# Patient Record
Sex: Female | Born: 1965 | Race: White | Hispanic: No | Marital: Married | Smoking: Current every day smoker
Health system: Southern US, Community
[De-identification: ages and names within clinical notes are randomized; demographics above are authoritative.]

## PROBLEM LIST (undated history)

## (undated) DIAGNOSIS — Z803 Family history of malignant neoplasm of breast: Secondary | ICD-10-CM

## (undated) DIAGNOSIS — Z86010 Personal history of colonic polyps: Secondary | ICD-10-CM

## (undated) DIAGNOSIS — K219 Gastro-esophageal reflux disease without esophagitis: Secondary | ICD-10-CM

## (undated) DIAGNOSIS — F17200 Nicotine dependence, unspecified, uncomplicated: Secondary | ICD-10-CM

## (undated) DIAGNOSIS — C50919 Malignant neoplasm of unspecified site of unspecified female breast: Secondary | ICD-10-CM

## (undated) DIAGNOSIS — Z923 Personal history of irradiation: Secondary | ICD-10-CM

## (undated) DIAGNOSIS — Z780 Asymptomatic menopausal state: Secondary | ICD-10-CM

## (undated) HISTORY — DX: Family history of malignant neoplasm of breast: Z80.3

## (undated) HISTORY — DX: Personal history of colonic polyps: Z86.010

## (undated) HISTORY — DX: Gastro-esophageal reflux disease without esophagitis: K21.9

---

## 1998-05-02 HISTORY — PX: BREAST ENHANCEMENT SURGERY: SHX7

## 2003-02-01 HISTORY — PX: BREAST IMPLANT REMOVAL: SUR1101

## 2013-01-31 DIAGNOSIS — Z923 Personal history of irradiation: Secondary | ICD-10-CM

## 2013-01-31 HISTORY — DX: Personal history of irradiation: Z92.3

## 2013-08-30 ENCOUNTER — Encounter (INDEPENDENT_AMBULATORY_CARE_PROVIDER_SITE_OTHER): Payer: Self-pay | Admitting: Surgery

## 2013-09-16 ENCOUNTER — Encounter (INDEPENDENT_AMBULATORY_CARE_PROVIDER_SITE_OTHER): Payer: Self-pay | Admitting: Surgery

## 2013-09-16 ENCOUNTER — Ambulatory Visit (INDEPENDENT_AMBULATORY_CARE_PROVIDER_SITE_OTHER): Payer: BC Managed Care – PPO | Admitting: Surgery

## 2013-09-16 VITALS — BP 110/60 | HR 70 | Resp 18 | Ht 63.0 in | Wt 146.0 lb

## 2013-09-16 DIAGNOSIS — C50412 Malignant neoplasm of upper-outer quadrant of left female breast: Secondary | ICD-10-CM | POA: Insufficient documentation

## 2013-09-16 DIAGNOSIS — D0590 Unspecified type of carcinoma in situ of unspecified breast: Secondary | ICD-10-CM

## 2013-09-16 DIAGNOSIS — D0512 Intraductal carcinoma in situ of left breast: Secondary | ICD-10-CM

## 2013-09-16 NOTE — Patient Instructions (Signed)
Our schedulers will call back to schedule surgery

## 2013-09-16 NOTE — Progress Notes (Signed)
Patient ID: Sophia Bryant, female   DOB: [birth year], 48 y.o.   MRN: [id number]  Chief Complaint  Patient presents with  . Other    Eval new left br ca    HPI Sophia Bryant is a 48 y.o. female.    HPIshe is referred by Dr.Maute in [place] after the recent diagnosis of ductal carcinoma in situ of the left breast. She had abnormal calcifications appear on her left breast screening mammogram. She has since had a stereotactic biopsy which showed DCIS. Invasive cancer could not totally be ruled out. She had ultrasound showing no mass. She does have cysts in her right breast. She denies nipple discharge. She has had a previous history of implants which have since been removed from her breasts. There is no family history of breast cancer.  History reviewed. No pertinent past medical history.  Past Surgical History  Procedure Laterality Date  . Breast enhancement surgery  05/1998  . Removal of bilateral tissue expanders with placement of bilateral breast implants  09/2003    Family History  Problem Relation Age of Onset  . Cancer Father     Social History History  Substance Use Topics  . Smoking status: Current Every Day Smoker  . Smokeless tobacco: Not on file  . Alcohol Use: Yes    Allergies  Allergen Reactions  . Erythromycin Hives and Itching    No current outpatient prescriptions on file.   No current facility-administered medications for this visit.    Review of Systems Review of Systems  Constitutional: Negative for fever, chills and unexpected weight change.  HENT: Negative for congestion, hearing loss, sore throat, trouble swallowing and voice change.   Eyes: Negative for visual disturbance.  Respiratory: Negative for cough and wheezing.   Cardiovascular: Negative for chest pain, palpitations and leg swelling.  Gastrointestinal: Negative for nausea, vomiting, abdominal pain, diarrhea, constipation, blood in stool, abdominal distention and anal bleeding.   Genitourinary: Negative for hematuria, vaginal bleeding and difficulty urinating.  Musculoskeletal: Negative for arthralgias.  Skin: Negative for rash and wound.  Neurological: Negative for seizures, syncope and headaches.  Hematological: Negative for adenopathy. Does not bruise/bleed easily.  Psychiatric/Behavioral: Negative for confusion.    Blood pressure 110/60, pulse 70, resp. rate 18, height 5\' 3"  (1.6 m), weight 146 lb (66.225 kg).  Physical Exam Physical Exam  Constitutional: She is oriented to person, place, and time. She appears well-developed and well-nourished. No distress.  HENT:  Head: Normocephalic and atraumatic.  Right Ear: External ear normal.  Left Ear: External ear normal.  Nose: Nose normal.  Mouth/Throat: Oropharynx is clear and moist. No oropharyngeal exudate.  Eyes: Conjunctivae are normal. Pupils are equal, round, and reactive to light. Right eye exhibits no discharge. Left eye exhibits no discharge. No scleral icterus.  Neck: Normal range of motion. Neck supple. No tracheal deviation present.  Cardiovascular: Normal rate, regular rhythm, normal heart sounds and intact distal pulses.   No murmur heard. Pulmonary/Chest: Effort normal and breath sounds normal. No respiratory distress. She has no wheezes.  Musculoskeletal: Normal range of motion. She exhibits no edema and no tenderness.  Lymphadenopathy:    She has no cervical adenopathy.    She has no axillary adenopathy.  Neurological: She is alert and oriented to person, place, and time.  Skin: Skin is warm and dry. No rash noted. She is not diaphoretic. No erythema.  Psychiatric: Her behavior is normal. Judgment normal.    Data Reviewed I have her mammograms demonstrating the calcifications  in the upper-outer quadrant left breast. The biopsy confirms ductal carcinoma in situ.  Assessment    Left breast ductal carcinoma in situ     Plan    I discussed this with the patient and her family in  detail. I discussed removing just the cancer with breast conservation versus mastectomy. I discussed needle localized biopsy. At the time of her original biopsy, a marker clip was not placed. This may make it slightly difficult for our radiologist but hopefully not. I discussed the risks of surgery which includes but is not limited to bleeding, infection, need for further surgery if the margins are positive, finding invasive cancer, et Ronney Asters. She understands and wishes to proceed with surgery        Arthor Gorter A 09/16/2013, 10:22 AM

## 2013-09-23 ENCOUNTER — Encounter (HOSPITAL_BASED_OUTPATIENT_CLINIC_OR_DEPARTMENT_OTHER): Payer: Self-pay | Admitting: *Deleted

## 2013-09-23 NOTE — Progress Notes (Signed)
No labs needed

## 2013-09-24 NOTE — H&P (Signed)
Chief Complaint   Patient presents with   .  Other     Eval new left br ca   HPI  Sophia Bryant is a 48 y.o. female.  HPIshe is referred by Dr.Maute in [place] after the recent diagnosis of ductal carcinoma in situ of the left breast. She had abnormal calcifications appear on her left breast screening mammogram. She has since had a stereotactic biopsy which showed DCIS. Invasive cancer could not totally be ruled out. She had ultrasound showing no mass. She does have cysts in her right breast. She denies nipple discharge. She has had a previous history of implants which have since been removed from her breasts. There is no family history of breast cancer.  History reviewed. No pertinent past medical history.  Past Surgical History   Procedure  Laterality  Date   .  Breast enhancement surgery   05/1998   .  Removal of bilateral tissue expanders with placement of bilateral breast implants   09/2003    Family History   Problem  Relation  Age of Onset   .  Cancer  Father    Social History  History   Substance Use Topics   .  Smoking status:  Current Every Day Smoker   .  Smokeless tobacco:  Not on file   .  Alcohol Use:  Yes    Allergies   Allergen  Reactions   .  Erythromycin  Hives and Itching    No current outpatient prescriptions on file.    No current facility-administered medications for this visit.   Review of Systems  Review of Systems  Constitutional: Negative for fever, chills and unexpected weight change.  HENT: Negative for congestion, hearing loss, sore throat, trouble swallowing and voice change.  Eyes: Negative for visual disturbance.  Respiratory: Negative for cough and wheezing.  Cardiovascular: Negative for chest pain, palpitations and leg swelling.  Gastrointestinal: Negative for nausea, vomiting, abdominal pain, diarrhea, constipation, blood in stool, abdominal distention and anal bleeding.  Genitourinary: Negative for hematuria, vaginal bleeding and  difficulty urinating.  Musculoskeletal: Negative for arthralgias.  Skin: Negative for rash and wound.  Neurological: Negative for seizures, syncope and headaches.  Hematological: Negative for adenopathy. Does not bruise/bleed easily.  Psychiatric/Behavioral: Negative for confusion.  Blood pressure 110/60, pulse 70, resp. rate 18, height 5\' 3"  (1.6 m), weight 146 lb (66.225 kg).  Physical Exam  Physical Exam  Constitutional: She is oriented to person, place, and time. She appears well-developed and well-nourished. No distress.  HENT:  Head: Normocephalic and atraumatic.  Right Ear: External ear normal.  Left Ear: External ear normal.  Nose: Nose normal.  Mouth/Throat: Oropharynx is clear and moist. No oropharyngeal exudate.  Eyes: Conjunctivae are normal. Pupils are equal, round, and reactive to light. Right eye exhibits no discharge. Left eye exhibits no discharge. No scleral icterus.  Neck: Normal range of motion. Neck supple. No tracheal deviation present.  Cardiovascular: Normal rate, regular rhythm, normal heart sounds and intact distal pulses.  No murmur heard.  Pulmonary/Chest: Effort normal and breath sounds normal. No respiratory distress. She has no wheezes.  Musculoskeletal: Normal range of motion. She exhibits no edema and no tenderness.  Lymphadenopathy:  She has no cervical adenopathy.  She has no axillary adenopathy.  Neurological: She is alert and oriented to person, place, and time.  Skin: Skin is warm and dry. No rash noted. She is not diaphoretic. No erythema.  Psychiatric: Her behavior is normal. Judgment normal.  Data  Reviewed  I have her mammograms demonstrating the calcifications in the upper-outer quadrant left breast. The biopsy confirms ductal carcinoma in situ.  Assessment  Left breast ductal carcinoma in situ  Plan  I discussed this with the patient and her family in detail. I discussed removing just the cancer with breast conservation versus mastectomy. I  discussed needle localized biopsy. At the time of her original biopsy, a marker clip was not placed. This may make it slightly difficult for our radiologist but hopefully not. I discussed the risks of surgery which includes but is not limited to bleeding, infection, need for further surgery if the margins are positive, finding invasive cancer, et Ronney Asters. She understands and wishes to proceed with surgery

## 2013-09-25 ENCOUNTER — Ambulatory Visit
Admission: RE | Admit: 2013-09-25 | Discharge: 2013-09-25 | Disposition: A | Discharge status: Home / Self Care | Payer: BC Managed Care – PPO | Source: Ambulatory Visit | Attending: Surgery | Admitting: Surgery

## 2013-09-25 ENCOUNTER — Ambulatory Visit (HOSPITAL_BASED_OUTPATIENT_CLINIC_OR_DEPARTMENT_OTHER): Payer: BC Managed Care – PPO | Admitting: Anesthesiology

## 2013-09-25 ENCOUNTER — Encounter (HOSPITAL_BASED_OUTPATIENT_CLINIC_OR_DEPARTMENT_OTHER): Payer: Self-pay | Admitting: *Deleted

## 2013-09-25 ENCOUNTER — Encounter (HOSPITAL_BASED_OUTPATIENT_CLINIC_OR_DEPARTMENT_OTHER): Payer: BC Managed Care – PPO | Admitting: Anesthesiology

## 2013-09-25 ENCOUNTER — Ambulatory Visit (HOSPITAL_BASED_OUTPATIENT_CLINIC_OR_DEPARTMENT_OTHER)
Admission: RE | Admit: 2013-09-25 | Discharge: 2013-09-25 | Disposition: A | Discharge status: Home / Self Care | Payer: BC Managed Care – PPO | Source: Ambulatory Visit | Attending: Surgery | Admitting: Surgery

## 2013-09-25 ENCOUNTER — Encounter (HOSPITAL_BASED_OUTPATIENT_CLINIC_OR_DEPARTMENT_OTHER)
Admission: RE | Disposition: A | Discharge status: Home / Self Care | Payer: Self-pay | Source: Ambulatory Visit | Attending: Surgery

## 2013-09-25 ENCOUNTER — Other Ambulatory Visit (INDEPENDENT_AMBULATORY_CARE_PROVIDER_SITE_OTHER): Payer: Self-pay | Admitting: Surgery

## 2013-09-25 DIAGNOSIS — D0512 Intraductal carcinoma in situ of left breast: Secondary | ICD-10-CM

## 2013-09-25 DIAGNOSIS — C50919 Malignant neoplasm of unspecified site of unspecified female breast: Secondary | ICD-10-CM

## 2013-09-25 DIAGNOSIS — F17200 Nicotine dependence, unspecified, uncomplicated: Secondary | ICD-10-CM | POA: Insufficient documentation

## 2013-09-25 DIAGNOSIS — D0590 Unspecified type of carcinoma in situ of unspecified breast: Secondary | ICD-10-CM | POA: Insufficient documentation

## 2013-09-25 HISTORY — PX: BREAST LUMPECTOMY WITH NEEDLE LOCALIZATION: SHX5759

## 2013-09-25 HISTORY — PX: BREAST LUMPECTOMY: SHX2

## 2013-09-25 HISTORY — DX: Asymptomatic menopausal state: Z78.0

## 2013-09-25 HISTORY — DX: Nicotine dependence, unspecified, uncomplicated: F17.200

## 2013-09-25 SURGERY — BREAST LUMPECTOMY WITH NEEDLE LOCALIZATION
Anesthesia: General | Site: Breast | Laterality: Left

## 2013-09-25 MED ORDER — MIDAZOLAM HCL 2 MG/2ML IJ SOLN: 1.0000 mg | INTRAMUSCULAR | Status: DC | PRN

## 2013-09-25 MED ORDER — FENTANYL CITRATE 0.05 MG/ML IJ SOLN
INTRAMUSCULAR | Status: AC
  Filled 2013-09-25: qty 2

## 2013-09-25 MED ORDER — DEXAMETHASONE SODIUM PHOSPHATE 4 MG/ML IJ SOLN
INTRAMUSCULAR | Status: DC | PRN
  Administered 2013-09-25: 10 mg via INTRAVENOUS

## 2013-09-25 MED ORDER — OXYCODONE HCL 5 MG PO TABS: 5.0000 mg | ORAL_TABLET | Freq: Once | ORAL | Status: DC | PRN

## 2013-09-25 MED ORDER — FENTANYL CITRATE 0.05 MG/ML IJ SOLN
25.0000 ug | INTRAMUSCULAR | Status: DC | PRN
  Administered 2013-09-25: 50 ug via INTRAVENOUS

## 2013-09-25 MED ORDER — CEFAZOLIN SODIUM-DEXTROSE 2-3 GM-% IV SOLR
INTRAVENOUS | Status: AC
  Filled 2013-09-25: qty 50

## 2013-09-25 MED ORDER — FENTANYL CITRATE 0.05 MG/ML IJ SOLN
INTRAMUSCULAR | Status: DC | PRN
  Administered 2013-09-25 (×2): 50 ug via INTRAVENOUS

## 2013-09-25 MED ORDER — LACTATED RINGERS IV SOLN
INTRAVENOUS | Status: DC
  Administered 2013-09-25 (×2): via INTRAVENOUS

## 2013-09-25 MED ORDER — HYDROCODONE-ACETAMINOPHEN 5-325 MG PO TABS: 1.0000 | ORAL_TABLET | ORAL | Status: DC | PRN

## 2013-09-25 MED ORDER — CEFAZOLIN SODIUM-DEXTROSE 2-3 GM-% IV SOLR
2.0000 g | INTRAVENOUS | Status: AC
  Administered 2013-09-25: 2 g via INTRAVENOUS

## 2013-09-25 MED ORDER — MIDAZOLAM HCL 5 MG/5ML IJ SOLN
INTRAMUSCULAR | Status: DC | PRN
  Administered 2013-09-25: 2 mg via INTRAVENOUS

## 2013-09-25 MED ORDER — FENTANYL CITRATE 0.05 MG/ML IJ SOLN
INTRAMUSCULAR | Status: AC
  Filled 2013-09-25: qty 6

## 2013-09-25 MED ORDER — FENTANYL CITRATE 0.05 MG/ML IJ SOLN: 50.0000 ug | INTRAMUSCULAR | Status: DC | PRN

## 2013-09-25 MED ORDER — PROPOFOL 10 MG/ML IV BOLUS
INTRAVENOUS | Status: DC | PRN
  Administered 2013-09-25: 200 mg via INTRAVENOUS

## 2013-09-25 MED ORDER — ONDANSETRON HCL 4 MG/2ML IJ SOLN: 4.0000 mg | Freq: Four times a day (QID) | INTRAMUSCULAR | Status: DC | PRN

## 2013-09-25 MED ORDER — LIDOCAINE HCL (CARDIAC) 20 MG/ML IV SOLN
INTRAVENOUS | Status: DC | PRN
  Administered 2013-09-25: 60 mg via INTRAVENOUS

## 2013-09-25 MED ORDER — CEFAZOLIN SODIUM-DEXTROSE 2-3 GM-% IV SOLR: 2.0000 g | INTRAVENOUS | Status: DC

## 2013-09-25 MED ORDER — BUPIVACAINE-EPINEPHRINE 0.5% -1:200000 IJ SOLN
INTRAMUSCULAR | Status: DC | PRN
  Administered 2013-09-25: 20 mL

## 2013-09-25 MED ORDER — MIDAZOLAM HCL 2 MG/2ML IJ SOLN
INTRAMUSCULAR | Status: AC
  Filled 2013-09-25: qty 2

## 2013-09-25 MED ORDER — OXYCODONE HCL 5 MG/5ML PO SOLN: 5.0000 mg | Freq: Once | ORAL | Status: DC | PRN

## 2013-09-25 MED ORDER — ONDANSETRON HCL 4 MG/2ML IJ SOLN
INTRAMUSCULAR | Status: DC | PRN
  Administered 2013-09-25: 4 mg via INTRAVENOUS

## 2013-09-25 SURGICAL SUPPLY — 45 items
BENZOIN TINCTURE PRP APPL 2/3 (GAUZE/BANDAGES/DRESSINGS) ×3 IMPLANT
BLADE HEX COATED 2.75 (ELECTRODE) ×3 IMPLANT
BLADE SURG 15 STRL LF DISP TIS (BLADE) ×1 IMPLANT
BLADE SURG 15 STRL SS (BLADE) ×2
CANISTER SUCT 1200ML W/VALVE (MISCELLANEOUS) IMPLANT
CHLORAPREP W/TINT 26ML (MISCELLANEOUS) ×3 IMPLANT
CLIP TI WIDE RED SMALL 6 (CLIP) IMPLANT
CLOSURE WOUND 1/2 X4 (GAUZE/BANDAGES/DRESSINGS) ×1
COVER MAYO STAND STRL (DRAPES) ×3 IMPLANT
COVER TABLE BACK 60X90 (DRAPES) ×3 IMPLANT
DECANTER SPIKE VIAL GLASS SM (MISCELLANEOUS) IMPLANT
DEVICE DUBIN W/COMP PLATE 8390 (MISCELLANEOUS) IMPLANT
DRAPE PED LAPAROTOMY (DRAPES) ×3 IMPLANT
DRAPE UTILITY XL STRL (DRAPES) ×3 IMPLANT
DRSG TEGADERM 4X4.75 (GAUZE/BANDAGES/DRESSINGS) ×3 IMPLANT
ELECT REM PT RETURN 9FT ADLT (ELECTROSURGICAL) ×3
ELECTRODE REM PT RTRN 9FT ADLT (ELECTROSURGICAL) ×1 IMPLANT
GLOVE BIO SURGEON STRL SZ 6.5 (GLOVE) ×2 IMPLANT
GLOVE BIO SURGEONS STRL SZ 6.5 (GLOVE) ×1
GLOVE BIOGEL PI IND STRL 7.0 (GLOVE) ×2 IMPLANT
GLOVE BIOGEL PI INDICATOR 7.0 (GLOVE) ×4
GLOVE SURG SIGNA 7.5 PF LTX (GLOVE) ×3 IMPLANT
GOWN STRL REUS W/ TWL LRG LVL3 (GOWN DISPOSABLE) ×1 IMPLANT
GOWN STRL REUS W/ TWL XL LVL3 (GOWN DISPOSABLE) ×1 IMPLANT
GOWN STRL REUS W/TWL LRG LVL3 (GOWN DISPOSABLE) ×2
GOWN STRL REUS W/TWL XL LVL3 (GOWN DISPOSABLE) ×2
KIT MARKER MARGIN INK (KITS) ×3 IMPLANT
NEEDLE HYPO 25X1 1.5 SAFETY (NEEDLE) ×3 IMPLANT
NS IRRIG 1000ML POUR BTL (IV SOLUTION) ×3 IMPLANT
PACK BASIN DAY SURGERY FS (CUSTOM PROCEDURE TRAY) ×3 IMPLANT
PENCIL BUTTON HOLSTER BLD 10FT (ELECTRODE) ×3 IMPLANT
SLEEVE SCD COMPRESS KNEE MED (MISCELLANEOUS) IMPLANT
SPONGE GAUZE 4X4 12PLY STER LF (GAUZE/BANDAGES/DRESSINGS) ×3 IMPLANT
SPONGE LAP 4X18 X RAY DECT (DISPOSABLE) ×3 IMPLANT
STRIP CLOSURE SKIN 1/2X4 (GAUZE/BANDAGES/DRESSINGS) ×2 IMPLANT
SUT MNCRL AB 4-0 PS2 18 (SUTURE) ×3 IMPLANT
SUT SILK 2 0 SH (SUTURE) ×3 IMPLANT
SUT VIC AB 3-0 SH 27 (SUTURE) ×2
SUT VIC AB 3-0 SH 27X BRD (SUTURE) ×1 IMPLANT
SYR CONTROL 10ML LL (SYRINGE) ×3 IMPLANT
TOWEL OR 17X24 6PK STRL BLUE (TOWEL DISPOSABLE) ×3 IMPLANT
TOWEL OR NON WOVEN STRL DISP B (DISPOSABLE) ×3 IMPLANT
TUBE CONNECTING 20'X1/4 (TUBING)
TUBE CONNECTING 20X1/4 (TUBING) IMPLANT
YANKAUER SUCT BULB TIP NO VENT (SUCTIONS) IMPLANT

## 2013-09-25 NOTE — Interval H&P Note (Signed)
History and Physical Interval Note: no change in H and P  09/25/2013 12:10 PM  [name]  has presented today for surgery, with the diagnosis of left breast DCIS  The various methods of treatment have been discussed with the patient and family. After consideration of risks, benefits and other options for treatment, the patient has consented to  Procedure(s): BREAST LUMPECTOMY LEFT NEEDLE LOCALIZATION (Left) as Bryant surgical intervention .  The patient's history has been reviewed, patient examined, no change in status, stable for surgery.  I have reviewed the patient's chart and labs.  Questions were answered to the patient's satisfaction.     Sophia Bryant

## 2013-09-25 NOTE — Transfer of Care (Signed)
Immediate Anesthesia Transfer of Care Note  Patient: Sophia Bryant  Procedure(s) Performed: Procedure(s): BREAST LUMPECTOMY LEFT NEEDLE LOCALIZATION (Left)  Patient Location: PACU  Anesthesia Type:General  Level of Consciousness: awake and patient cooperative  Airway & Oxygen Therapy: Patient Spontanous Breathing and Patient connected to face mask oxygen  Post-op Assessment: Report given to PACU RN and Post -op Vital signs reviewed and stable  Post vital signs: Reviewed and stable  Complications: No apparent anesthesia complications

## 2013-09-25 NOTE — Anesthesia Postprocedure Evaluation (Signed)
Anesthesia Post Note  Patient: Sophia Bryant  Procedure(s) Performed: Procedure(s) (LRB): BREAST LUMPECTOMY LEFT NEEDLE LOCALIZATION (Left)  Anesthesia type: General  Patient location: PACU  Post pain: Pain level controlled and Adequate analgesia  Post assessment: Post-op Vital signs reviewed, Patient's Cardiovascular Status Stable, Respiratory Function Stable, Patent Airway and Pain level controlled  Last Vitals:  Filed Vitals:   09/25/13 1430  BP: 92/51  Pulse: 72  Temp:   Resp: 16    Post vital signs: Reviewed and stable  Level of consciousness: awake, alert  and oriented  Complications: No apparent anesthesia complications

## 2013-09-25 NOTE — Anesthesia Procedure Notes (Signed)
Procedure Name: LMA Insertion Date/Time: 09/25/2013 12:54 PM Performed by: Jennfer Gassen Pre-anesthesia Checklist: Patient identified, Emergency Drugs available, Suction available and Patient being monitored Patient Re-evaluated:Patient Re-evaluated prior to inductionOxygen Delivery Method: Circle System Utilized Preoxygenation: Pre-oxygenation with 100% oxygen Intubation Type: IV induction Ventilation: Mask ventilation without difficulty LMA: LMA inserted LMA Size: 4.0 Number of attempts: 1 Airway Equipment and Method: bite block Placement Confirmation: positive ETCO2 Tube secured with: Tape Dental Injury: Teeth and Oropharynx as per pre-operative assessment

## 2013-09-25 NOTE — Anesthesia Preprocedure Evaluation (Signed)
Anesthesia Evaluation  Patient identified by MRN, date of birth, ID band Patient awake    Reviewed: Allergy & Precautions, H&P , NPO status , Patient's Chart, lab work & pertinent test results  Airway Mallampati: II  Neck ROM: full    Dental   Pulmonary Current Smoker,          Cardiovascular negative cardio ROS      Neuro/Psych    GI/Hepatic   Endo/Other    Renal/GU      Musculoskeletal   Abdominal   Peds  Hematology   Anesthesia Other Findings   Reproductive/Obstetrics                           Anesthesia Physical Anesthesia Plan  ASA: II  Anesthesia Plan: General   Post-op Pain Management:    Induction: Intravenous  Airway Management Planned: LMA  Additional Equipment:   Intra-op Plan:   Post-operative Plan:   Informed Consent: I have reviewed the patients History and Physical, chart, labs and discussed the procedure including the risks, benefits and alternatives for the proposed anesthesia with the patient or authorized representative who has indicated his/her understanding and acceptance.     Plan Discussed with: CRNA, Anesthesiologist and Surgeon  Anesthesia Plan Comments:         Anesthesia Quick Evaluation

## 2013-09-25 NOTE — Op Note (Signed)
BREAST LUMPECTOMY LEFT NEEDLE LOCALIZATION  Procedure Note  Sophia Bryant 09/25/2013   Pre-op Diagnosis: left breast DCIS     Post-op Diagnosis: same  Procedure(s): BREAST LUMPECTOMY LEFT NEEDLE LOCALIZATION  Surgeon(s): Harl Bowie, MD  Anesthesia: General  Staff:  Circulator: Lauris Chroman, RN Relief Circulator: Humberto Seals, RN; Eda Paschal, RN Relief Scrub: Irineo Axon Bouchillon, RN  Estimated Blood Loss: Minimal               Specimens: sent to path          [facility]   Date: 09/25/2013  Time: 1:30 PM

## 2013-09-25 NOTE — Discharge Instructions (Signed)
[facility] °Office Phone Number [phone] ° °BREAST BIOPSY/ PARTIAL MASTECTOMY: POST OP INSTRUCTIONS ° °Always review your discharge instruction sheet given to you by the facility where your surgery was performed. ° °IF YOU HAVE DISABILITY OR FAMILY LEAVE FORMS, YOU MUST BRING THEM TO THE OFFICE FOR PROCESSING.  DO NOT GIVE THEM TO YOUR DOCTOR. ° °1. A prescription for pain medication may be given to you upon discharge.  Take your pain medication as prescribed, if needed.  If narcotic pain medicine is not needed, then you may take acetaminophen (Tylenol) or ibuprofen (Advil) as needed. °2. Take your usually prescribed medications unless otherwise directed °3. If you need a refill on your pain medication, please contact your pharmacy.  They will contact our office to request authorization.  Prescriptions will not be filled after 5pm or on week-ends. °4. You should eat very light the first 24 hours after surgery, such as soup, crackers, pudding, etc.  Resume your normal diet the day after surgery. °5. Most patients will experience some swelling and bruising in the breast.  Ice packs and a good support bra will help.  Swelling and bruising can take several days to resolve.  °6. It is common to experience some constipation if taking pain medication after surgery.  Increasing fluid intake and taking a stool softener will usually help or prevent this problem from occurring.  A mild laxative (Milk of Magnesia or Miralax) should be taken according to package directions if there are no bowel movements after 48 hours. °7. Unless discharge instructions indicate otherwise, you may remove your bandages 24-48 hours after surgery, and you may shower at that time.  You may have steri-strips (small skin tapes) in place directly over the incision.  These strips should be left on the skin for 7-10 days.  If your surgeon used skin glue on the incision, you may shower in 24 hours.  The glue will flake off over the  next 2-3 weeks.  Any sutures or staples will be removed at the office during your follow-up visit. °8. ACTIVITIES:  You may resume regular daily activities (gradually increasing) beginning the next day.  Wearing a good support bra or sports bra minimizes pain and swelling.  You may have sexual intercourse when it is comfortable. °a. You may drive when you no longer are taking prescription pain medication, you can comfortably wear a seatbelt, and you can safely maneuver your car and apply brakes. °b. RETURN TO WORK:  ______________________________________________________________________________________ °9. You should see your doctor in the office for a follow-up appointment approximately two weeks after your surgery.  Your doctor’s nurse will typically make your follow-up appointment when she calls you with your pathology report.  Expect your pathology report 2-3 business days after your surgery.  You may call to check if you do not hear from us after three days. °10. OTHER INSTRUCTIONS: _______________________________________________________________________________________________ _____________________________________________________________________________________________________________________________________ °_____________________________________________________________________________________________________________________________________ °_____________________________________________________________________________________________________________________________________ ° °WHEN TO CALL YOUR DOCTOR: °1. Fever over 101.0 °2. Nausea and/or vomiting. °3. Extreme swelling or bruising. °4. Continued bleeding from incision. °5. Increased pain, redness, or drainage from the incision. ° °The clinic staff is available to answer your questions during regular business hours.  Please don’t hesitate to call and ask to speak to one of the nurses for clinical concerns.  If you have a medical emergency, go to the nearest  emergency room or call 911.  A surgeon from [facility] is always on call at the hospital. ° °For further questions, please visit [email or web address]  ° ° °  Post Anesthesia Home Care Instructions ° °Activity: °Get plenty of rest for the remainder of the day. A responsible adult should stay with you for 24 hours following the procedure.  °For the next 24 hours, DO NOT: °-Drive a car °-Operate machinery °-Drink alcoholic beverages °-Take any medication unless instructed by your physician °-Make any legal decisions or sign important papers. ° °Meals: °Start with liquid foods such as gelatin or soup. Progress to regular foods as tolerated. Avoid greasy, spicy, heavy foods. If nausea and/or vomiting occur, drink only clear liquids until the nausea and/or vomiting subsides. Call your physician if vomiting continues. ° °Special Instructions/Symptoms: °Your throat may feel dry or sore from the anesthesia or the breathing tube placed in your throat during surgery. If this causes discomfort, gargle with warm salt water. The discomfort should disappear within 24 hours. ° °

## 2013-09-26 ENCOUNTER — Encounter (HOSPITAL_BASED_OUTPATIENT_CLINIC_OR_DEPARTMENT_OTHER): Payer: Self-pay | Admitting: Surgery

## 2013-09-26 LAB — POCT HEMOGLOBIN-HEMACUE: Hemoglobin: 15.6 g/dL — ABNORMAL HIGH (ref 12.0–15.0)

## 2013-09-29 NOTE — Op Note (Signed)
NAMETAMIYA, COLELLO NO.:  [id number]  MEDICAL RECORD NO.:  [id number]  LOCATION:                                 FACILITY:  PHYSICIAN:  Coralie Keens, M.D. DATE OF BIRTH:  04/01/1965  DATE OF PROCEDURE:  09/25/2013 DATE OF DISCHARGE:  09/25/2013                              OPERATIVE REPORT   PREOPERATIVE DIAGNOSIS:  Left breast ductal carcinoma in situ.  POSTOPERATIVE DIAGNOSIS:  Left breast ductal carcinoma in.  PROCEDURES:  Needle localized left breast lumpectomy.  SURGEON:  Coralie Keens, M.D.  ANESTHESIA:  General and 0.5% Marcaine.  ESTIMATED BLOOD LOSS:  Minimal.  INDICATIONS:  This is a 48 year old female, who presents with abnormal calcifications in the left breast.  Stereotactic biopsy was performed, which showed ductal carcinoma in situ.  The invasive disease cannot be ruled out.  Decision was made to proceed with a needle localized left breast lumpectomy.  PROCEDURE IN DETAIL:  The patient had a localization wire placed in left breast at the [facility].  She was then brought over to the operating room.  She was placed supine on the operating table and general anesthesia was induced.  Her left breast was then prepped and draped in usual sterile fashion.  I anesthetized the skin and the upper outer quadrant of the left breast along the localization wire.  I then made an elliptical incision with a scalpel incorporating the wire.  I then took this down to the breast tissue with electrocautery.  I then performed a wide lumpectomy going all way down to the chest wall.  It was dissected circumferentially around localization wire dissecting out all the breast tissue with wide margins around the wire.  Once the specimen was completely removed, it was painted with marker paint in all quadrants.  It was then x-rayed and suspicious calcifications were found in the center of the biopsy specimen.  The specimen was sent  to Pathology for evaluation.  I anesthetized the wound further with Marcaine.  I achieved hemostasis with cautery.  I then closed subcutaneous tissue with interrupted 3-0 Vicryl sutures and closed the skin with a running 4-0 Monocryl.  Steri-Strips, gauze, and Tegaderm were then applied.  The patient tolerated the procedure well.  All the counts were correct at the end of procedure.  The patient was then extubated in the operating room and taken in stable condition to recovery room.    Coralie Keens, M.D.    DB/MEDQ  D:  09/25/2013  T:  09/25/2013  Job:  [id number]

## 2013-09-30 ENCOUNTER — Other Ambulatory Visit (INDEPENDENT_AMBULATORY_CARE_PROVIDER_SITE_OTHER): Payer: Self-pay | Admitting: Surgery

## 2013-10-03 ENCOUNTER — Encounter (HOSPITAL_COMMUNITY): Payer: Self-pay | Admitting: Pharmacy Technician

## 2013-10-04 ENCOUNTER — Encounter (HOSPITAL_COMMUNITY): Payer: Self-pay | Admitting: *Deleted

## 2013-10-04 ENCOUNTER — Inpatient Hospital Stay (HOSPITAL_COMMUNITY): Admission: RE | Admit: 2013-10-04 | Payer: BC Managed Care – PPO | Source: Ambulatory Visit

## 2013-10-07 MED ORDER — CEFAZOLIN SODIUM-DEXTROSE 2-3 GM-% IV SOLR
2.0000 g | INTRAVENOUS | Status: AC
  Administered 2013-10-08: 2 g via INTRAVENOUS
  Filled 2013-10-07: qty 50

## 2013-10-07 NOTE — H&P (Signed)
Sophia Bryant is an 48 y.o. female.   Chief Complaint: positive margin HPI: she is s/p left breast lumpectomy for DCIS.  One margin was focally positive so re-excision is recommended.  She is doing well and has no complaints  Past Medical History  Diagnosis Date  . Smokes   . Postmenopausal     greater than a yr since menses-2015  . Breast cancer     Past Surgical History  Procedure Laterality Date  . Breast enhancement surgery  05/1998  . Breast implant removal  2005    bilat  . Breast lumpectomy with needle localization Left 09/25/2013    Procedure: BREAST LUMPECTOMY LEFT NEEDLE LOCALIZATION;  Surgeon: Harl Bowie, MD;  Location: [facility];  Service: General;  Laterality: Left;    Family History  Problem Relation Age of Onset  . Cancer Father    Social History:  reports that she has been smoking.  She does not have any smokeless tobacco history on file. She reports that she drinks alcohol. She reports that she does not use illicit drugs.  Allergies:  Allergies  Allergen Reactions  . Erythromycin Hives and Itching    No prescriptions prior to admission    No results found for this or any previous visit (from the past 48 hour(s)). No results found.  Review of Systems  All other systems reviewed and are negative.   Height 5\' 3"  (1.6 m), weight 144 lb (65.318 kg). Physical Exam  Constitutional: She appears well-developed and well-nourished. No distress.  HENT:  Head: Normocephalic and atraumatic.  Left Ear: External ear normal.  Eyes: Pupils are equal, round, and reactive to light.  Neck: Normal range of motion.  Cardiovascular: Normal rate and regular rhythm.   Respiratory: Effort normal and breath sounds normal.  GI: Soft. There is no tenderness.  Musculoskeletal: Normal range of motion.  Neurological: She is alert.  Skin: Skin is warm.  Psychiatric: Her behavior is normal.   left breast incision healing well  Assessment/Plan DCIS left  breast  Will proceed to the OR for re-excision of the left breast to try and achieve negative margins.  I explained the risks which include bleeding, infection, need for further surgery is margins are again positive, etc.  She agrees to proceed.  Sophia Bryant A 10/07/2013, 12:57 PM

## 2013-10-08 ENCOUNTER — Encounter (HOSPITAL_COMMUNITY): Payer: BC Managed Care – PPO | Admitting: Anesthesiology

## 2013-10-08 ENCOUNTER — Ambulatory Visit (HOSPITAL_COMMUNITY): Payer: BC Managed Care – PPO | Admitting: Anesthesiology

## 2013-10-08 ENCOUNTER — Encounter (HOSPITAL_COMMUNITY)
Admission: RE | Disposition: A | Discharge status: Home / Self Care | Payer: Self-pay | Source: Ambulatory Visit | Attending: Surgery

## 2013-10-08 ENCOUNTER — Encounter (HOSPITAL_COMMUNITY): Payer: Self-pay | Admitting: Certified Registered"

## 2013-10-08 ENCOUNTER — Ambulatory Visit (HOSPITAL_COMMUNITY)
Admission: RE | Admit: 2013-10-08 | Discharge: 2013-10-08 | Disposition: A | Discharge status: Home / Self Care | Payer: BC Managed Care – PPO | Source: Ambulatory Visit | Attending: Surgery | Admitting: Surgery

## 2013-10-08 DIAGNOSIS — F17200 Nicotine dependence, unspecified, uncomplicated: Secondary | ICD-10-CM | POA: Diagnosis not present

## 2013-10-08 DIAGNOSIS — D0590 Unspecified type of carcinoma in situ of unspecified breast: Secondary | ICD-10-CM | POA: Insufficient documentation

## 2013-10-08 HISTORY — PX: RE-EXCISION OF BREAST CANCER,SUPERIOR MARGINS: SHX6047

## 2013-10-08 HISTORY — DX: Malignant neoplasm of unspecified site of unspecified female breast: C50.919

## 2013-10-08 LAB — CBC
HEMATOCRIT: 42.5 % (ref 36.0–46.0)
Hemoglobin: 14.4 g/dL (ref 12.0–15.0)
MCH: 30.5 pg (ref 26.0–34.0)
MCHC: 33.9 g/dL (ref 30.0–36.0)
MCV: 90 fL (ref 78.0–100.0)
PLATELETS: 269 10*3/uL (ref 150–400)
RBC: 4.72 MIL/uL (ref 3.87–5.11)
RDW: 12.8 % (ref 11.5–15.5)
WBC: 9.4 10*3/uL (ref 4.0–10.5)

## 2013-10-08 LAB — BASIC METABOLIC PANEL
ANION GAP: 10 (ref 5–15)
BUN: 14 mg/dL (ref 6–23)
CHLORIDE: 103 meq/L (ref 96–112)
CO2: 26 meq/L (ref 19–32)
CREATININE: 0.65 mg/dL (ref 0.50–1.10)
Calcium: 9.4 mg/dL (ref 8.4–10.5)
GFR calc non Af Amer: >90 mL/min (ref >90)
Glucose, Bld: 95 mg/dL (ref 70–99)
POTASSIUM: 4.6 meq/L (ref 3.7–5.3)
Sodium: 139 mEq/L (ref 137–147)

## 2013-10-08 SURGERY — RE-EXCISION OF BREAST CANCER,SUPERIOR MARGINS
Anesthesia: General | Site: Breast | Laterality: Left

## 2013-10-08 MED ORDER — MIDAZOLAM HCL 5 MG/5ML IJ SOLN
INTRAMUSCULAR | Status: DC | PRN
  Administered 2013-10-08: 2 mg via INTRAVENOUS

## 2013-10-08 MED ORDER — PROPOFOL 10 MG/ML IV BOLUS
INTRAVENOUS | Status: AC
  Filled 2013-10-08: qty 20

## 2013-10-08 MED ORDER — BUPIVACAINE-EPINEPHRINE (PF) 0.25% -1:200000 IJ SOLN
INTRAMUSCULAR | Status: AC
  Filled 2013-10-08: qty 30

## 2013-10-08 MED ORDER — LIDOCAINE HCL (CARDIAC) 20 MG/ML IV SOLN
INTRAVENOUS | Status: AC
  Filled 2013-10-08: qty 5

## 2013-10-08 MED ORDER — KETOROLAC TROMETHAMINE 30 MG/ML IJ SOLN
INTRAMUSCULAR | Status: AC
  Filled 2013-10-08: qty 1

## 2013-10-08 MED ORDER — 0.9 % SODIUM CHLORIDE (POUR BTL) OPTIME
TOPICAL | Status: DC | PRN
  Administered 2013-10-08: 1000 mL

## 2013-10-08 MED ORDER — PROMETHAZINE HCL 25 MG/ML IJ SOLN: 6.2500 mg | INTRAMUSCULAR | Status: DC | PRN

## 2013-10-08 MED ORDER — SODIUM CHLORIDE 0.9 % IJ SOLN
INTRAMUSCULAR | Status: AC
  Filled 2013-10-08: qty 10

## 2013-10-08 MED ORDER — LIDOCAINE HCL (CARDIAC) 20 MG/ML IV SOLN
INTRAVENOUS | Status: DC | PRN
  Administered 2013-10-08: 100 mg via INTRAVENOUS

## 2013-10-08 MED ORDER — BUPIVACAINE-EPINEPHRINE 0.25% -1:200000 IJ SOLN
INTRAMUSCULAR | Status: DC | PRN
  Administered 2013-10-08: 10 mL

## 2013-10-08 MED ORDER — OXYCODONE HCL 5 MG PO TABS: 5.0000 mg | ORAL_TABLET | Freq: Once | ORAL | Status: DC | PRN

## 2013-10-08 MED ORDER — ONDANSETRON HCL 4 MG/2ML IJ SOLN
INTRAMUSCULAR | Status: AC
  Filled 2013-10-08: qty 2

## 2013-10-08 MED ORDER — LACTATED RINGERS IV SOLN
INTRAVENOUS | Status: DC | PRN
  Administered 2013-10-08: 07:00:00 via INTRAVENOUS

## 2013-10-08 MED ORDER — FENTANYL CITRATE 0.05 MG/ML IJ SOLN
INTRAMUSCULAR | Status: DC | PRN
  Administered 2013-10-08 (×2): 25 ug via INTRAVENOUS

## 2013-10-08 MED ORDER — KETOROLAC TROMETHAMINE 30 MG/ML IJ SOLN
INTRAMUSCULAR | Status: DC | PRN
  Administered 2013-10-08: 30 mg via INTRAVENOUS

## 2013-10-08 MED ORDER — PROPOFOL 10 MG/ML IV BOLUS
INTRAVENOUS | Status: DC | PRN
  Administered 2013-10-08: 150 mg via INTRAVENOUS

## 2013-10-08 MED ORDER — SUCCINYLCHOLINE CHLORIDE 20 MG/ML IJ SOLN
INTRAMUSCULAR | Status: AC
  Filled 2013-10-08: qty 1

## 2013-10-08 MED ORDER — MIDAZOLAM HCL 2 MG/2ML IJ SOLN
INTRAMUSCULAR | Status: AC
  Filled 2013-10-08: qty 2

## 2013-10-08 MED ORDER — HYDROMORPHONE HCL PF 1 MG/ML IJ SOLN
INTRAMUSCULAR | Status: DC
Start: 2013-10-08 — End: 2013-10-08
  Filled 2013-10-08: qty 1

## 2013-10-08 MED ORDER — FENTANYL CITRATE 0.05 MG/ML IJ SOLN
INTRAMUSCULAR | Status: AC
  Filled 2013-10-08: qty 5

## 2013-10-08 MED ORDER — OXYCODONE HCL 5 MG PO TABS
ORAL_TABLET | ORAL | Status: AC
  Administered 2013-10-08: 5 mg
  Filled 2013-10-08: qty 1

## 2013-10-08 MED ORDER — EPHEDRINE SULFATE 50 MG/ML IJ SOLN
INTRAMUSCULAR | Status: AC
  Filled 2013-10-08: qty 1

## 2013-10-08 MED ORDER — ONDANSETRON HCL 4 MG/2ML IJ SOLN
INTRAMUSCULAR | Status: DC | PRN
  Administered 2013-10-08: 4 mg via INTRAVENOUS

## 2013-10-08 MED ORDER — HYDROMORPHONE HCL PF 1 MG/ML IJ SOLN
0.2500 mg | INTRAMUSCULAR | Status: DC | PRN
  Administered 2013-10-08: 0.5 mg via INTRAVENOUS

## 2013-10-08 MED ORDER — OXYCODONE HCL 5 MG/5ML PO SOLN: 5.0000 mg | Freq: Once | ORAL | Status: DC | PRN

## 2013-10-08 MED ORDER — PHENYLEPHRINE 40 MCG/ML (10ML) SYRINGE FOR IV PUSH (FOR BLOOD PRESSURE SUPPORT)
PREFILLED_SYRINGE | INTRAVENOUS | Status: AC
  Filled 2013-10-08: qty 10

## 2013-10-08 SURGICAL SUPPLY — 44 items
BENZOIN TINCTURE PRP APPL 2/3 (GAUZE/BANDAGES/DRESSINGS) ×3 IMPLANT
CANISTER SUCTION 2500CC (MISCELLANEOUS) IMPLANT
CHLORAPREP W/TINT 26ML (MISCELLANEOUS) ×3 IMPLANT
CLOSURE WOUND 1/2 X4 (GAUZE/BANDAGES/DRESSINGS) ×1
CONT SPEC 4OZ CLIKSEAL STRL BL (MISCELLANEOUS) ×3 IMPLANT
COVER SURGICAL LIGHT HANDLE (MISCELLANEOUS) ×3 IMPLANT
DECANTER SPIKE VIAL GLASS SM (MISCELLANEOUS) ×3 IMPLANT
DRAPE CHEST BREAST 15X10 FENES (DRAPES) ×3 IMPLANT
DRAPE PED LAPAROTOMY (DRAPES) ×3 IMPLANT
DRAPE UTILITY 15X26 W/TAPE STR (DRAPE) ×6 IMPLANT
DRSG TEGADERM 4X4.75 (GAUZE/BANDAGES/DRESSINGS) ×3 IMPLANT
ELECT CAUTERY BLADE 6.4 (BLADE) ×3 IMPLANT
ELECT REM PT RETURN 9FT ADLT (ELECTROSURGICAL) ×3
ELECTRODE REM PT RTRN 9FT ADLT (ELECTROSURGICAL) ×1 IMPLANT
GAUZE SPONGE 4X4 12PLY STRL (GAUZE/BANDAGES/DRESSINGS) ×3 IMPLANT
GLOVE BIO SURGEON STRL SZ7 (GLOVE) ×3 IMPLANT
GLOVE BIOGEL PI IND STRL 7.0 (GLOVE) ×2 IMPLANT
GLOVE BIOGEL PI INDICATOR 7.0 (GLOVE) ×4
GLOVE SURG SIGNA 7.5 PF LTX (GLOVE) ×3 IMPLANT
GLOVE SURG SS PI 7.0 STRL IVOR (GLOVE) ×3 IMPLANT
GOWN STRL REUS W/ TWL LRG LVL3 (GOWN DISPOSABLE) ×1 IMPLANT
GOWN STRL REUS W/ TWL XL LVL3 (GOWN DISPOSABLE) ×1 IMPLANT
GOWN STRL REUS W/TWL LRG LVL3 (GOWN DISPOSABLE) ×2
GOWN STRL REUS W/TWL XL LVL3 (GOWN DISPOSABLE) ×2
KIT BASIN OR (CUSTOM PROCEDURE TRAY) ×3 IMPLANT
KIT ROOM TURNOVER OR (KITS) ×3 IMPLANT
NEEDLE HYPO 25GX1X1/2 BEV (NEEDLE) ×3 IMPLANT
NS IRRIG 1000ML POUR BTL (IV SOLUTION) ×3 IMPLANT
PACK SURGICAL SETUP 50X90 (CUSTOM PROCEDURE TRAY) ×3 IMPLANT
PAD ARMBOARD 7.5X6 YLW CONV (MISCELLANEOUS) ×3 IMPLANT
PENCIL BUTTON HOLSTER BLD 10FT (ELECTRODE) ×3 IMPLANT
SPONGE GAUZE 4X4 12PLY STER LF (GAUZE/BANDAGES/DRESSINGS) ×3 IMPLANT
SPONGE LAP 4X18 X RAY DECT (DISPOSABLE) ×3 IMPLANT
STRIP CLOSURE SKIN 1/2X4 (GAUZE/BANDAGES/DRESSINGS) ×2 IMPLANT
SUT MNCRL AB 4-0 PS2 18 (SUTURE) ×3 IMPLANT
SUT VIC AB 3-0 SH 27 (SUTURE) ×2
SUT VIC AB 3-0 SH 27X BRD (SUTURE) ×1 IMPLANT
SYR BULB 3OZ (MISCELLANEOUS) ×3 IMPLANT
SYR CONTROL 10ML LL (SYRINGE) ×3 IMPLANT
TOWEL OR 17X24 6PK STRL BLUE (TOWEL DISPOSABLE) ×3 IMPLANT
TOWEL OR 17X26 10 PK STRL BLUE (TOWEL DISPOSABLE) ×3 IMPLANT
TUBE CONNECTING 12'X1/4 (SUCTIONS)
TUBE CONNECTING 12X1/4 (SUCTIONS) IMPLANT
YANKAUER SUCT BULB TIP NO VENT (SUCTIONS) IMPLANT

## 2013-10-08 NOTE — Progress Notes (Signed)
Report given to angel britt rn as caregiver

## 2013-10-08 NOTE — Op Note (Signed)
[name], [name] NO.:  [id number]  MEDICAL RECORD NO.:  [id number]  LOCATION:  [facility]                         FACILITY:  [facility]  PHYSICIAN:  Coralie Keens, M.D. DATE OF BIRTH:  04/08/1965  DATE OF PROCEDURE:  10/08/2013 DATE OF DISCHARGE:                              OPERATIVE REPORT   PREOPERATIVE DIAGNOSIS:  Ductal carcinoma in situ of the left breast.  POSTOPERATIVE DIAGNOSIS:  Ductal carcinoma in situ of the left breast.  PROCEDURE:  Re-excision of ductal carcinoma in situ of the left breast.  SURGEON:  Coralie Keens, M.D.  ANESTHESIA:  General and 0.5% Marcaine.  ESTIMATED BLOOD LOSS:  Minimal.  INDICATIONS:  This is a 48 year old female, who underwent a left breast lumpectomy for ductal carcinoma in situ.  The anterior margin is focally positive.  Decision was made to proceed with re-excision of the margins.  PROCEDURE IN DETAIL:  The patient was brought to the operating room, identified as [name].  She was placed supine on the operating table and general anesthesia was induced.  Her left breast was then prepped and draped in usual sterile fashion.  I anesthetized the previous scar and surrounding breast tissue with Marcaine.  I then performed elliptical incision removing the previous scar with the scalpel.  I took this down to the breast tissue with electrocautery.  The previous lumpectomy site was quite superficial.  I excised the anterior margin widely and then went ahead and excised the entire biopsy cavity as it was superficial.  This was sent to Pathology for evaluation.  I then achieved hemostasis with the cautery.  I anesthetized the wound further with Marcaine.  I then closed the subcutaneous tissue with interrupted 3- 0 Vicryl sutures and closed the skin with a running 4-0 Monocryl.  Steri- Strips, gauze, and Tegaderm were then applied.  The patient tolerated the procedure well.  All the counts were correct at the end of  the procedure.  The patient was then extubated in the operating room and taken in a stable condition to recovery room.     Coralie Keens, M.D.     DB/MEDQ  D:  10/08/2013  T:  10/08/2013  Job:  [id number]

## 2013-10-08 NOTE — Anesthesia Postprocedure Evaluation (Signed)
  Anesthesia Post-op Note  Patient: Sophia Bryant  Procedure(s) Performed: Procedure(s): RE-EXCISION LEFT BREAST DCIS (Left)  Patient Location: PACU  Anesthesia Type:General  Level of Consciousness: awake and alert   Airway and Oxygen Therapy: Patient Spontanous Breathing  Post-op Pain: mild  Post-op Assessment: Post-op Vital signs reviewed  Post-op Vital Signs: stable  Last Vitals:  Filed Vitals:   10/08/13 0840  BP: 94/41  Pulse: 59  Temp:   Resp: 17    Complications: No apparent anesthesia complications

## 2013-10-08 NOTE — Interval H&P Note (Signed)
History and Physical Interval Note: no change in H and P  10/08/2013 6:48 AM  Sophia Bryant  has presented today for surgery, with the diagnosis of re-excision left breast DCIS  The various methods of treatment have been discussed with the patient and family. After consideration of risks, benefits and other options for treatment, the patient has consented to  Procedure(s): RE-EXCISION LEFT BREAST DCIS (Left) as a surgical intervention .  The patient's history has been reviewed, patient examined, no change in status, stable for surgery.  I have reviewed the patient's chart and labs.  Questions were answered to the patient's satisfaction.     Ishmail Mcmanamon A

## 2013-10-08 NOTE — Op Note (Signed)
RE-EXCISION LEFT BREAST DCIS  Procedure Note  Sophia Bryant 10/08/2013   Pre-op Diagnosis: re-excision left breast DCIS     Post-op Diagnosis: same  Procedure(s): RE-EXCISION LEFT BREAST DCIS  Surgeon(s): Coralie Keens, MD  Anesthesia: General  Staff:  Circulator: Cyd Silence, RN Scrub Person: Leslie Andrea, CST Circulator Assistant: Jaynie Crumble, RN  Estimated Blood Loss: Minimal               Specimens: sent to path          [facility] A   Date: 10/08/2013  Time: 7:51 AM

## 2013-10-08 NOTE — Transfer of Care (Signed)
Immediate Anesthesia Transfer of Care Note  Patient: Sophia Bryant  Procedure(s) Performed: Procedure(s): RE-EXCISION LEFT BREAST DCIS (Left)  Patient Location: PACU  Anesthesia Type:General  Level of Consciousness: awake, alert , oriented and patient cooperative  Airway & Oxygen Therapy: Patient Spontanous Breathing and Patient connected to nasal cannula oxygen  Post-op Assessment: Report given to PACU RN, Post -op Vital signs reviewed and stable and Patient moving all extremities  Post vital signs: Reviewed and stable  Complications: No apparent anesthesia complications

## 2013-10-08 NOTE — Anesthesia Preprocedure Evaluation (Signed)
Anesthesia Evaluation  Patient identified by MRN, date of birth, ID band Patient awake    Reviewed: Allergy & Precautions, H&P , NPO status , Patient's Chart, lab work & pertinent test results  Airway Mallampati: II  Neck ROM: full    Dental   Pulmonary Current Smoker,          Cardiovascular negative cardio ROS  Rhythm:regular Rate:Normal     Neuro/Psych    GI/Hepatic   Endo/Other    Renal/GU      Musculoskeletal   Abdominal   Peds  Hematology   Anesthesia Other Findings   Reproductive/Obstetrics                           Anesthesia Physical Anesthesia Plan  ASA: II  Anesthesia Plan: General and General LMA   Post-op Pain Management:    Induction: Intravenous  Airway Management Planned:   Additional Equipment:   Intra-op Plan:   Post-operative Plan: Extubation in OR  Informed Consent: I have reviewed the patients History and Physical, chart, labs and discussed the procedure including the risks, benefits and alternatives for the proposed anesthesia with the patient or authorized representative who has indicated his/her understanding and acceptance.   Dental Advisory Given  Plan Discussed with: CRNA  Anesthesia Plan Comments:         Anesthesia Quick Evaluation

## 2013-10-08 NOTE — Anesthesia Procedure Notes (Signed)
Procedure Name: LMA Insertion Date/Time: 10/08/2013 7:16 AM Performed by: Julian Reil Pre-anesthesia Checklist: Patient identified, Emergency Drugs available, Suction available and Patient being monitored Patient Re-evaluated:Patient Re-evaluated prior to inductionOxygen Delivery Method: Circle system utilized Preoxygenation: Pre-oxygenation with 100% oxygen Intubation Type: IV induction LMA: LMA inserted LMA Size: 4.0 Tube type: Oral Number of attempts: 1 Placement Confirmation: positive ETCO2 and breath sounds checked- equal and bilateral Tube secured with: Tape Dental Injury: Teeth and Oropharynx as per pre-operative assessment

## 2013-10-08 NOTE — Discharge Instructions (Signed)
[facility] °Office Phone Number [phone] ° °BREAST BIOPSY/ PARTIAL MASTECTOMY: POST OP INSTRUCTIONS ° °Always review your discharge instruction sheet given to you by the facility where your surgery was performed. ° °IF YOU HAVE DISABILITY OR FAMILY LEAVE FORMS, YOU MUST BRING THEM TO THE OFFICE FOR PROCESSING.  DO NOT GIVE THEM TO YOUR DOCTOR. ° °1. A prescription for pain medication may be given to you upon discharge.  Take your pain medication as prescribed, if needed.  If narcotic pain medicine is not needed, then you may take acetaminophen (Tylenol) or ibuprofen (Advil) as needed. °2. Take your usually prescribed medications unless otherwise directed °3. If you need a refill on your pain medication, please contact your pharmacy.  They will contact our office to request authorization.  Prescriptions will not be filled after 5pm or on week-ends. °4. You should eat very light the first 24 hours after surgery, such as soup, crackers, pudding, etc.  Resume your normal diet the day after surgery. °5. Most patients will experience some swelling and bruising in the breast.  Ice packs and a good support bra will help.  Swelling and bruising can take several days to resolve.  °6. It is common to experience some constipation if taking pain medication after surgery.  Increasing fluid intake and taking a stool softener will usually help or prevent this problem from occurring.  A mild laxative (Milk of Magnesia or Miralax) should be taken according to package directions if there are no bowel movements after 48 hours. °7. Unless discharge instructions indicate otherwise, you may remove your bandages 24-48 hours after surgery, and you may shower at that time.  You may have steri-strips (small skin tapes) in place directly over the incision.  These strips should be left on the skin for 7-10 days.  If your surgeon used skin glue on the incision, you may shower in 24 hours.  The glue will flake off over the  next 2-3 weeks.  Any sutures or staples will be removed at the office during your follow-up visit. °8. ACTIVITIES:  You may resume regular daily activities (gradually increasing) beginning the next day.  Wearing a good support bra or sports bra minimizes pain and swelling.  You may have sexual intercourse when it is comfortable. °a. You may drive when you no longer are taking prescription pain medication, you can comfortably wear a seatbelt, and you can safely maneuver your car and apply brakes. °b. RETURN TO WORK:  ______________________________________________________________________________________ °9. You should see your doctor in the office for a follow-up appointment approximately two weeks after your surgery.  Your doctor’s nurse will typically make your follow-up appointment when she calls you with your pathology report.  Expect your pathology report 2-3 business days after your surgery.  You may call to check if you do not hear from us after three days. °10. OTHER INSTRUCTIONS: _______________________________________________________________________________________________ _____________________________________________________________________________________________________________________________________ °_____________________________________________________________________________________________________________________________________ °_____________________________________________________________________________________________________________________________________ ° °WHEN TO CALL YOUR DOCTOR: °1. Fever over 101.0 °2. Nausea and/or vomiting. °3. Extreme swelling or bruising. °4. Continued bleeding from incision. °5. Increased pain, redness, or drainage from the incision. ° °The clinic staff is available to answer your questions during regular business hours.  Please don’t hesitate to call and ask to speak to one of the nurses for clinical concerns.  If you have a medical emergency, go to the nearest  emergency room or call 911.  A surgeon from [facility] is always on call at the hospital. ° °For further questions, please visit [email or web address]  °

## 2013-10-10 ENCOUNTER — Encounter (HOSPITAL_COMMUNITY): Payer: Self-pay | Admitting: Surgery

## 2013-10-14 ENCOUNTER — Telehealth (INDEPENDENT_AMBULATORY_CARE_PROVIDER_SITE_OTHER): Payer: Self-pay

## 2013-10-14 ENCOUNTER — Encounter (INDEPENDENT_AMBULATORY_CARE_PROVIDER_SITE_OTHER): Payer: BC Managed Care – PPO | Admitting: Surgery

## 2013-10-14 DIAGNOSIS — D0512 Intraductal carcinoma in situ of left breast: Secondary | ICD-10-CM

## 2013-10-14 NOTE — Telephone Encounter (Signed)
Pt seen in office today by Dr Ninfa Linden and order placed in epic for medical oncology referral. Pt already has an appt with Dr Marlane Hatcher oncology for 10/23/13. Staff message being sent to [facility] and [facility].

## 2013-10-15 ENCOUNTER — Telehealth: Payer: Self-pay | Admitting: *Deleted

## 2013-10-15 NOTE — Telephone Encounter (Signed)
Received referral from [facility] for a med onc appt.  Called and left a message for the pt to return my call so I can schedule her.

## 2013-10-18 ENCOUNTER — Encounter: Payer: Self-pay | Admitting: Hematology and Oncology

## 2013-10-18 ENCOUNTER — Ambulatory Visit (HOSPITAL_BASED_OUTPATIENT_CLINIC_OR_DEPARTMENT_OTHER): Payer: BC Managed Care – PPO | Admitting: Hematology and Oncology

## 2013-10-18 ENCOUNTER — Ambulatory Visit: Payer: BC Managed Care – PPO

## 2013-10-18 VITALS — BP 114/58 | HR 81 | Temp 98.2°F | Resp 18 | Ht 63.0 in | Wt 145.0 lb

## 2013-10-18 DIAGNOSIS — Z171 Estrogen receptor negative status [ER-]: Secondary | ICD-10-CM

## 2013-10-18 DIAGNOSIS — D0592 Unspecified type of carcinoma in situ of left breast: Secondary | ICD-10-CM

## 2013-10-18 DIAGNOSIS — D0590 Unspecified type of carcinoma in situ of unspecified breast: Secondary | ICD-10-CM

## 2013-10-18 NOTE — Progress Notes (Signed)
[facility] CONSULT NOTE  Patient Care Team: No Pcp Per Patient as PCP - General (General Practice)  CHIEF COMPLAINTS/PURPOSE OF CONSULTATION:  Newly diagnosed DCIS left breast  HISTORY OF PRESENTING ILLNESS:  Sophia Bryant 48 y.o. Caucasian female is here because of recent diagnosis of left breast DCIS. Patient had a routine screening mammogram revealed calcifications in the left breast. I was a mammogram she also found cystic changes in the right breast. She underwent bilateral breast ultrasounds. Left breast ultrasound was normal but the right breast ultrasound detected 3 small cysts in the 9:00 and 10:00 position of the right breast. These were felt to be cysts and they recommended a six-month followup ultrasound. In the meantime she underwent a biopsy of the left breast calcifications on 08/27/2013 and that came back as high-grade DCIS ER/PR negative. She saw Dr. Ninfa Linden who took her to surgery on 09/25/2013 and underwent left breast lumpectomy because of positive anterior margin she underwent reresection of the margin on 10/07/2013. The final margins were all clear there was small residual cancer left behind on the reresection. She is here today to discuss the pathology and to discuss any adjuvant therapy options that may be needed.  Previously patient had breast augmentation in 2000 and breast implant removal in 2005. She complains of fluid buildup in the left breast at the site of the lumpectomy. She denies any pain or redness of any other symptom.  I reviewed her records extensively and collaborated the history with the patient.  SUMMARY OF ONCOLOGIC HISTORY:   Cancer of left breast, stage 0   08/27/2013 Initial Diagnosis Cancer of left breast, stage 0    09/25/2013 Surgery Left breast lumpectomy: High-grade DCIS with necrosis and calcifications, anterior margin involved by DCIS, ER 0% PR 0%, 1.5 cm    In terms of breast cancer risk profile:  She menarched at early age of 54  and went to menopause age is uncertain  She had 2 pregnancy, her first child was born at age 42  She has received birth control pills for approximately 20 years.  She was never exposed to fertility medications or hormone replacement therapy.  She has no immediate family history of Breast/GYN/GI cancer a cousin on father's side had breast cancer age 58 and uncle and mother's side had throat cancer age 51 father had liver and lung involvement a 57  MEDICAL HISTORY:  Past Medical History  Diagnosis Date  . Smokes   . Postmenopausal     greater than a yr since menses-2015  . Breast cancer     SURGICAL HISTORY: Past Surgical History  Procedure Laterality Date  . Breast enhancement surgery  05/1998  . Breast implant removal  2005    bilat  . Breast lumpectomy with needle localization Left 09/25/2013    Procedure: BREAST LUMPECTOMY LEFT NEEDLE LOCALIZATION;  Surgeon: Harl Bowie, MD;  Location: [facility];  Service: General;  Laterality: Left;  . Re-excision of breast cancer,superior margins Left 10/08/2013    Procedure: RE-EXCISION LEFT BREAST DCIS;  Surgeon: Coralie Keens, MD;  Location: [facility];  Service: General;  Laterality: Left;    SOCIAL HISTORY: History   Social History  . Marital Status: Married    Spouse Name: N/A    Number of Children: N/A  . Years of Education: N/A   Occupational History  . Not on file.   Social History Main Topics  . Smoking status: Current Every Day Smoker -- 0.50 packs/day  .  Smokeless tobacco: Not on file  . Alcohol Use: Yes     Comment: Social once a month  . Drug Use: No  . Sexual Activity: Not on file   Other Topics Concern  . Not on file   Social History Narrative  . No narrative on file    FAMILY HISTORY: Family History  Problem Relation Age of Onset  . Cancer Father     ALLERGIES:  is allergic to erythromycin.  MEDICATIONS:  No current outpatient prescriptions on file.   No current  facility-administered medications for this visit.    REVIEW OF SYSTEMS:   Constitutional: Denies fevers, chills or abnormal night sweats Eyes: Denies blurriness of vision, double vision or watery eyes Ears, nose, mouth, throat, and face: Denies mucositis or sore throat Respiratory: Denies cough, dyspnea or wheezes Cardiovascular: Denies palpitation, chest discomfort or lower extremity swelling Gastrointestinal:  Denies nausea, heartburn or change in bowel habits Skin: Denies abnormal skin rashes Lymphatics: Denies new lymphadenopathy or easy bruising Neurological:Denies numbness, tingling or new weaknesses Behavioral/Psych: Mood is stable, no new changes  Breast: Left breast slightly swollen probably seroma at the surgery site scar appears to be intact and clean All other systems were reviewed with the patient and are negative.  PHYSICAL EXAMINATION: ECOG PERFORMANCE STATUS: 0 - Asymptomatic  Filed Vitals:   10/18/13 1438  BP: 114/58  Pulse: 81  Temp: 98.2 F (36.8 C)  Resp: 18   Filed Weights   10/18/13 1438  Weight: 145 lb (65.772 kg)    GENERAL:alert, no distress and comfortable SKIN: skin color, texture, turgor are normal, no rashes or significant lesions EYES: normal, conjunctiva are pink and non-injected, sclera clear OROPHARYNX:no exudate, no erythema and lips, buccal mucosa, and tongue normal  NECK: supple, thyroid normal size, non-tender, without nodularity LYMPH:  no palpable lymphadenopathy in the cervical, axillary or inguinal LUNGS: clear to auscultation and percussion with normal breathing effort HEART: regular rate & rhythm and no murmurs and no lower extremity edema ABDOMEN:abdomen soft, non-tender and normal bowel sounds Musculoskeletal:no cyanosis of digits and no clubbing  PSYCH: alert & oriented x 3 with fluent speech NEURO: no focal motor/sensory deficits BREAST: Seroma the surgery area  LABORATORY DATA:  I have reviewed the data as listed Lab  Results  Component Value Date   WBC 9.4 10/08/2013   HGB 14.4 10/08/2013   HCT 42.5 10/08/2013   MCV 90.0 10/08/2013   PLT 269 10/08/2013   Lab Results  Component Value Date   NA 139 10/08/2013   K 4.6 10/08/2013   CL 103 10/08/2013   CO2 26 10/08/2013    RADIOGRAPHIC STUDIES: I have personally reviewed the radiological reports and agreed with the findings in the report.  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 Since the patient is ER/PR negative there is no indication of antiestrogen therapy. She would need radiation therapy and she is scheduled to see radiation oncology soon.  Surveillance: Annual mammograms with periodic breast exams. Because of the cystic changes in the right breast, I will order an ultrasound of the right breast in 6 months and followup after that.  Survivorship:Discussed the importance of physical exercise in decreasing the likelihood of breast cancer recurrence. Recommended 30 mins daily 6 days a week of either brisk walking or cycling or swimming. Encouraged patient to eat more fruits and  vegetables and decrease red meat.     All questions were answered. The patient knows to call the clinic with any problems, questions or concerns. I spent 55 minutes counseling the patient face to face. The total time spent in the appointment was 60 minutes and more than 50% was on counseling.     Rulon Eisenmenger, MD 10/18/2013 3:40 PM

## 2013-10-18 NOTE — Progress Notes (Signed)
Checked in new pt with no financial concerns at this time.  I informed pt of the [name] and gave her a flyer on what they assist with.  She has Sophia Bryant's card for any future questions or concerns.

## 2013-10-18 NOTE — Assessment & Plan Note (Signed)
DCIS high-grade ER/PR negative: Discussed with the patient, the details of pathology, the significance of ER, PR receptors and the implications for treatment. Since the patient is ER/PR negative there is no indication of antiestrogen therapy. She would need radiation therapy and she is scheduled to see radiation oncology soon.  Surveillance: Annual mammograms with periodic breast exams. Because of the cystic changes in the right breast, I will order an ultrasound of the right breast in 6 months and followup after that.  Survivorship:Discussed the importance of physical exercise in decreasing the likelihood of breast cancer recurrence. Recommended 30 mins daily 6 days a week of either brisk walking or cycling or swimming. Encouraged patient to eat more fruits and vegetables and decrease red meat.

## 2013-10-21 ENCOUNTER — Other Ambulatory Visit: Payer: Self-pay

## 2013-10-21 ENCOUNTER — Telehealth: Payer: Self-pay | Admitting: Hematology and Oncology

## 2013-10-21 ENCOUNTER — Other Ambulatory Visit: Payer: Self-pay | Admitting: Hematology and Oncology

## 2013-10-21 DIAGNOSIS — D0592 Unspecified type of carcinoma in situ of left breast: Secondary | ICD-10-CM

## 2013-10-21 DIAGNOSIS — N6011 Diffuse cystic mastopathy of right breast: Secondary | ICD-10-CM

## 2013-10-21 NOTE — Addendum Note (Signed)
Addended by: Prentiss Bells on: 10/21/2013 10:17 AM   Modules accepted: Orders

## 2013-10-21 NOTE — Telephone Encounter (Signed)
, °

## 2013-10-22 ENCOUNTER — Encounter: Payer: Self-pay | Admitting: Radiation Oncology

## 2013-10-22 NOTE — Progress Notes (Signed)
Note created during office visit by Dr. Gudena. Copy to patient, original to scan. 

## 2013-10-22 NOTE — Progress Notes (Signed)
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 female who was referred to Dr Ninfa Linden from [facility] after diagnosis of ductal carcinoma in situ of the left breast. She had abnormal calcifications appear on her left breast screening mammogram in the [facility]. A stereotactic biopsy showed DCIS. Invasive cancer could not totally be ruled out. She had an ultrasound showing no mass. She does have cysts in her right breast per July 2015 [place] report from [place].  6 month repeat US recommended. She had a previous history of implants which have since been removed from her breasts.   Lumpectomy on 8-26  by Dr Ninfa Linden revealed 1.5cm of high grade ER/PR negative DCIS with necrosis  as below in path section.  Positive margin prompted rexcision on 9-8, which did reveal residual DCIS, but margins are now clear. Dr. Lindi Adie does not recommend antiestrogen therapy.  She denies any chance of pregnancy, currently (her partner had a vasectomy.)  PREVIOUS RADIATION THERAPY: No  PAST MEDICAL HISTORY:  has a past medical history of Smokes; Postmenopausal; and Breast cancer.    PAST SURGICAL HISTORY: Past Surgical History  Procedure Laterality Date  . Breast enhancement surgery  05/1998  . Breast implant removal  2005    bilat  . Breast lumpectomy with needle localization Left 09/25/2013    Procedure: BREAST LUMPECTOMY LEFT NEEDLE LOCALIZATION;  Surgeon: Harl Bowie, MD;  Location: [place];  Service: General;  Laterality: Left;  . Re-excision of breast cancer,superior margins Left 10/08/2013    Procedure: RE-EXCISION LEFT BREAST  DCIS;  Surgeon: Coralie Keens, MD;  Location: [place];  Service: General;  Laterality: Left;    FAMILY HISTORY: family history includes Cancer in her father.  SOCIAL HISTORY:  reports that she has been smoking.  She does not have any smokeless tobacco history on file. She reports that she drinks alcohol. She reports that she does not use illicit drugs.  ALLERGIES: Erythromycin  MEDICATIONS:  Current Outpatient Prescriptions  Medication Sig Dispense Refill  . dextromethorphan 15 MG/5ML syrup Take 10 mLs by mouth as needed for cough.       No current facility-administered medications for this encounter.    REVIEW OF SYSTEMS:  Notable for that above.   PHYSICAL EXAM:  height is 5' 3"  (1.6 m) and weight is 145 lb 1.6 oz (65.817 kg). Her temperature is 98 F (36.7 C). Her blood pressure is 111/57 and her pulse is 92.   General: Alert and oriented, in no acute distress HEENT: Head is normocephalic.  Extraocular movements are intact. Oropharynx is clear. Neck: Neck is supple, no palpable cervical or supraclavicular lymphadenopathy. Heart: Regular in rate and rhythm with no murmurs, rubs, or gallops. Chest: Clear to auscultation bilaterally, with no rhonchi, wheezes, or rales. Abdomen: Soft, nontender, nondistended, with no rigidity or guarding. Extremities: No cyanosis or edema. Lymphatics: see HEENT Skin: No concerning lesions. Musculoskeletal: symmetric strength and muscle tone throughout. Neurologic: Cranial nerves II through XII are grossly intact. No obvious focalities. Speech is fluent. Coordination is intact. Psychiatric: Judgment and insight are intact. Affect is appropriate. BREASTS: well healing left [facility] lumpectomy scar with modest  seroma. No axillary nodes or other findings appreciated in either breast.    ECOG = 0  0 - Asymptomatic (Fully active, able to carry on all predisease activities without restriction)  1 - Symptomatic but completely ambulatory (Restricted in  physically strenuous activity but ambulatory and able to carry out work of a light or sedentary nature. For example, light housework, office work)  2 - Symptomatic, <50% in bed during the day (Ambulatory and capable of all self care but unable to carry out any work activities. Up and about more than 50% of waking hours)  3 - Symptomatic, >50% in bed, but not bedbound (Capable of only limited self-care, confined to bed or chair 50% or more of waking hours)  4 - Bedbound (Completely disabled. Cannot carry on any self-care. Totally confined to bed or chair)  5 - Death   Eustace Pen MM, Creech RH, Tormey DC, et al. [phone]). "Toxicity and response criteria of the [facility]". [place] Oncol. 5 (6): 649-55   LABORATORY DATA:  Lab Results  Component Value Date   WBC 9.4 10/08/2013   HGB 14.4 10/08/2013   HCT 42.5 10/08/2013   MCV 90.0 10/08/2013   PLT 269 10/08/2013   CMP     Component Value Date/Time   NA 139 10/08/2013 0653   K 4.6 10/08/2013 0653   CL 103 10/08/2013 0653   CO2 26 10/08/2013 0653   GLUCOSE 95 10/08/2013 0653   BUN 14 10/08/2013 0653   CREATININE 0.65 10/08/2013 0653   CALCIUM 9.4 10/08/2013 0653   GFRNONAA >90 10/08/2013 0653   GFRAA >90 10/08/2013 0653         RADIOGRAPHY: Mm Breast Surgical Specimen  09/25/2013   CLINICAL DATA:  Patient is post Needle localization and subsequent surgical excision of biopsy proven site of DCIS upper-outer left periareolar region.  EXAM: SPECIMEN RADIOGRAPH OF THE LEFT BREAST  COMPARISON:  Previous exam(s)  FINDINGS: Status post excision of the left breast. The wire tip and targeted biopsy site residual microcalcifications are present and are marked for pathology.  IMPRESSION: Specimen radiograph of the left breast.   Electronically Signed   By: Marin Olp M.D.   On: 09/25/2013 13:25   Mm Lt Plc Breast Loc Dev   1st Lesion  Inc Mammo Guide  09/25/2013   CLINICAL DATA:  Patient presents for wire localization of a biopsy-proven site of  DCIS over the upper-outer or left periareolar region. Note that there is no post biopsy clip present. There appears to be mild post biopsy change with single residual calcification at the biopsy site which will be targeted.  EXAM: NEEDLE LOCALIZATION OF THE left BREAST WITH MAMMO GUIDANCE  COMPARISON:  Previous exams.  FINDINGS: Patient presents for needle localization prior to surgical excision. I met with the patient and we discussed the procedure of needle localization including benefits and alternatives. We discussed the high likelihood of a successful procedure. We discussed the risks of the procedure, including infection, bleeding, tissue injury, and further surgery. Informed, written consent was given. The usual time-out protocol was performed immediately prior to the procedure.  Using mammographic guidance, sterile technique, 2% lidocaine and a 5 cm modified Kopans needle, targeted biopsy site/ residual calcifications were localized using superior to inferior approach. The films were marked for Dr. Ninfa Linden.  IMPRESSION: Needle localization left breast. No apparent complications.   Electronically Signed   By: Marin Olp M.D.   On: 09/25/2013 11:52   PATH:  09/25/13  Estrogen Receptor: 0%, NEGATIVE Progesterone Receptor: 0%, NEGATIVE COMMENT: The negative hormone receptor study(ies) in this case have an internal positive control. REFERENCE RANGE ESTROGEN RECEPTOR NEGATIVE <1% POSITIVE =>1% PROGESTERONE RECEPTOR NEGATIVE <1% POSITIVE =>1% All controls stained appropriately Enid Cutter MD Pathologist, Electronic Signature ( Signed 10/02/2013) FINAL DIAGNOSIS Diagnosis Breast, lumpectomy, left - HIGH GRADE DUCTAL CARCINOMA IN SITU WITH NECROSIS AND CALCIFICATIONS. - ANTERIOR MARGIN FOCALLY INVOLVED BY DCIS. - REMAINING MARGINS FREE. 1 of 3 Duplicate copy FINAL for ANI, DEOLIVEIRA [phone]) Microscopic Comment BREAST, IN SITU CARCINOMA Specimen, including laterality: Left  breast Procedure (include lymph node sampling sentinel-non-sentinel Needle localized lumpectomy without lymph node Grade of carcinoma: High grade Necrosis: Present Estimated tumor size: (glass slide measurement): 1.5 cm Treatment effect: No If present, treatment effect in breast tissue, lymph nodes or both: N/A Distance to closest margin: 0 cm from anterior margin If margin positive, focally or broadly: Focally Breast prognostic profile: Pending Estrogen receptor: Pending Progesterone receptor: Pending Lymph nodes: Examined: 0 Sentinel 0 Non-sentinel 0 Total Lymph nodes with metastasis: N/A Isolated tumor cells (< 0.2 mm): N/A Micrometastasis ( > 0.2 mm and < 2.0 mm): N/A Macrometastasis (> 2.0 mm): N/A Extranodal extension: N/A TNM: pTis, pNX   10/08/13 Diagnosis Breast, excision, left new anterior margin - RESIDUAL MICROSCOPIC INVOLVEMENT WITH HIGH GRADE DUCTAL CARCINOMA IN SITU, TUMOR SPANS LESS THAN 0.1 CM IN GREATEST DIMENSION. - MARGINS ARE NEGATIVE. - SEE COMMENT. Microscopic Comment Initial (non-reprocessed) sections demonstrate residual high grade ductal carcinoma in situ measuring less than 0.1 cm in greatest dimension. The tumor is 0.2 cm away from the closest inked margin. Abundant fat necrosis is present. Fibrocystic changes with associated calcifications and focal unusual ductal hyperplasia is also seen within the specimen.     IMPRESSION/PLAN: Stage 0 DCIS left breast.  It was a pleasure meeting the patient today. We discussed the risks, benefits, and side effects of radiotherapy, which would decreased risk of in-breast recurrences by about half. We discussed that radiation would take approximately 6 weeks to complete. We spoke about acute effects including skin irritation and fatigue as well as much less common late effects including lung and heart irritation. We spoke about the latest technology that is used to minimize the risk of late effects for breast cancer  patients undergoing radiotherapy. No guarantees of treatment were given. The patient is enthusiastic about proceeding with treatment. I look forward to participating in the patient's care.  She lives much closer to [place] and prefers RT at [place]. I will arrange simulation there with me.  I will also call radiology to see if a post-op mammogram is indicated. She understands there is a decent chance it will be. I would give her a few more weeks to heal to allow this to be done with as little discomfort as possible.    __________________________________________   Eppie Gibson, MD

## 2013-10-22 NOTE — Progress Notes (Signed)
Location of Breast Cancer:Left Breast  Histology per Pathology Report:   10/08/13 Diagnosis Breast, excision, left new anterior margin - RESIDUAL MICROSCOPIC INVOLVEMENT WITH HIGH GRADE DUCTAL CARCINOMA IN SITU, TUMOR SPANS LESS THAN 0.1 CM IN GREATEST DIMENSION. - MARGINS ARE NEGATIVE. - SEE COMMENT  09/25/13 Diagnosis Breast, lumpectomy, left - HIGH GRADE DUCTAL CARCINOMA IN SITU WITH NECROSIS AND CALCIFICATIONS. - ANTERIOR MARGIN FOCALLY INVOLVED BY DCIS. - REMAINING MARGINS FREE.  08/27/13 Ductal Carcinoma In-Situ - Left Breast Calcifications, Stereotactic Core Needle Biopsies Times five.   Cannot rule out Nearby Infiltrating Carcinoma  Receptor Status: ER(Neg), PR (Neg), Her2-neu (3+), Ki-67 (>75%)  Ms Sophia Bryant was referred to Dr Ninfa Linden from [facility] after diagnosis of ductal carcinoma in situ of the left breast. She had abnormal calcifications appear on her left breast screening mammogram. A stereotactic biopsy showed DCIS. Invasive cancer could not totally be ruled out. She had ultrasound showing no mass. She does have cysts in her right breast. She had a previous history of implants which have since been removed from her breasts.    Past/Anticipated interventions by surgeon, if any: Biopsy/ Lumpectomy  Past/Anticipated interventions by medical oncology, if any: Chemotherapy: Nicholas Lose - 10/18/13- Referred back to Radiation Therapy since status is DCIS/ ER/PR negative.  Lymphedema issues, if any:  None  Pain issues, if any: C/o intermittent pain in her left breast  SAFETY ISSUES:  Prior radiation? No  Pacemaker/ICD?No  Possible current pregnancy?No  Is the patient on methotrexate? No  Current Complaints / other details:   Menarche age 53, G59,P2, parity age 28, BC x 20 years. Peri- Menopause- uncertain when cycle stopped

## 2013-10-23 ENCOUNTER — Encounter: Payer: Self-pay | Admitting: Radiation Oncology

## 2013-10-23 ENCOUNTER — Ambulatory Visit
Admission: RE | Admit: 2013-10-23 | Discharge: 2013-10-23 | Disposition: A | Discharge status: Home / Self Care | Payer: BC Managed Care – PPO | Source: Ambulatory Visit | Attending: Radiation Oncology | Admitting: Radiation Oncology

## 2013-10-23 VITALS — BP 111/57 | HR 92 | Temp 98.0°F | Ht 63.0 in | Wt 145.1 lb

## 2013-10-23 DIAGNOSIS — C50419 Malignant neoplasm of upper-outer quadrant of unspecified female breast: Secondary | ICD-10-CM | POA: Insufficient documentation

## 2013-10-23 DIAGNOSIS — N641 Fat necrosis of breast: Secondary | ICD-10-CM | POA: Insufficient documentation

## 2013-10-23 DIAGNOSIS — Z510 Encounter for antineoplastic radiation therapy: Secondary | ICD-10-CM | POA: Diagnosis not present

## 2013-10-23 DIAGNOSIS — N6009 Solitary cyst of unspecified breast: Secondary | ICD-10-CM | POA: Diagnosis not present

## 2013-10-23 DIAGNOSIS — C50412 Malignant neoplasm of upper-outer quadrant of left female breast: Secondary | ICD-10-CM

## 2013-10-23 DIAGNOSIS — Z171 Estrogen receptor negative status [ER-]: Secondary | ICD-10-CM | POA: Diagnosis not present

## 2013-10-23 DIAGNOSIS — F17200 Nicotine dependence, unspecified, uncomplicated: Secondary | ICD-10-CM | POA: Insufficient documentation

## 2013-10-23 DIAGNOSIS — C50919 Malignant neoplasm of unspecified site of unspecified female breast: Secondary | ICD-10-CM

## 2013-10-23 NOTE — Progress Notes (Signed)
FMLA paperwork to [place], [place] for completion on 10/23/13

## 2013-10-25 ENCOUNTER — Encounter: Payer: Self-pay | Admitting: Radiation Oncology

## 2013-10-25 ENCOUNTER — Other Ambulatory Visit: Payer: Self-pay | Admitting: Radiation Oncology

## 2013-10-25 DIAGNOSIS — C50412 Malignant neoplasm of upper-outer quadrant of left female breast: Secondary | ICD-10-CM

## 2013-10-25 DIAGNOSIS — C50419 Malignant neoplasm of upper-outer quadrant of unspecified female breast: Secondary | ICD-10-CM | POA: Insufficient documentation

## 2013-10-28 ENCOUNTER — Telehealth: Payer: Self-pay | Admitting: *Deleted

## 2013-10-28 ENCOUNTER — Telehealth: Payer: Self-pay

## 2013-10-28 NOTE — Telephone Encounter (Signed)
Patient called to confirm appointment for mammogram on 11/11/13.She will need to reschedule.I gave her the number [facility].

## 2013-10-28 NOTE — Telephone Encounter (Signed)
CALLED PATIENT TO INFORM OF [facility] AND [facility] APPT. IN [place] ON 11-12-13 - ARRIVAL TIME 7:45 AM, SPOKE WITH PATIENT AND SHE IS AWARE OF THESE APPTS.

## 2013-11-11 ENCOUNTER — Ambulatory Visit
Admission: RE | Admit: 2013-11-11 | Discharge: 2013-11-11 | Disposition: A | Discharge status: Home / Self Care | Payer: BC Managed Care – PPO | Source: Ambulatory Visit | Attending: Radiation Oncology | Admitting: Radiation Oncology

## 2013-11-11 DIAGNOSIS — C50412 Malignant neoplasm of upper-outer quadrant of left female breast: Secondary | ICD-10-CM

## 2013-12-03 ENCOUNTER — Encounter: Payer: Self-pay | Admitting: Radiation Oncology

## 2014-04-11 ENCOUNTER — Ambulatory Visit
Admission: RE | Admit: 2014-04-11 | Discharge: 2014-04-11 | Disposition: A | Discharge status: Home / Self Care | Payer: BLUE CROSS/BLUE SHIELD | Source: Ambulatory Visit | Attending: Hematology and Oncology | Admitting: Hematology and Oncology

## 2014-04-11 DIAGNOSIS — N6011 Diffuse cystic mastopathy of right breast: Secondary | ICD-10-CM

## 2014-04-11 DIAGNOSIS — D0592 Unspecified type of carcinoma in situ of left breast: Secondary | ICD-10-CM

## 2014-04-18 ENCOUNTER — Telehealth: Payer: Self-pay | Admitting: Hematology and Oncology

## 2014-04-18 ENCOUNTER — Ambulatory Visit (HOSPITAL_BASED_OUTPATIENT_CLINIC_OR_DEPARTMENT_OTHER): Payer: BLUE CROSS/BLUE SHIELD | Admitting: Hematology and Oncology

## 2014-04-18 VITALS — BP 106/69 | HR 82 | Temp 98.1°F | Resp 18 | Ht 63.0 in | Wt 137.8 lb

## 2014-04-18 DIAGNOSIS — C50412 Malignant neoplasm of upper-outer quadrant of left female breast: Secondary | ICD-10-CM

## 2014-04-18 DIAGNOSIS — Z171 Estrogen receptor negative status [ER-]: Secondary | ICD-10-CM

## 2014-04-18 DIAGNOSIS — Z853 Personal history of malignant neoplasm of breast: Secondary | ICD-10-CM

## 2014-04-18 NOTE — Telephone Encounter (Signed)
Called patient and she is aware of her appointments °

## 2014-04-18 NOTE — Progress Notes (Signed)
Patient Care Team: No Pcp Per Patient as PCP - General (General Practice)  DIAGNOSIS: Breast cancer of upper-outer quadrant of left female breast   Staging form: Breast, AJCC 7th Edition     Clinical: No stage assigned - Unsigned     Pathologic: Stage 0 (Tis, N0, cM0) - Signed by Rulon Eisenmenger, MD on 10/18/2013   SUMMARY OF ONCOLOGIC HISTORY:   Breast cancer of upper-outer quadrant of left female breast   08/27/2013 Initial Diagnosis Cancer of left breast, stage 0    09/25/2013 Surgery Left breast lumpectomy: High-grade DCIS with necrosis and calcifications, anterior margin involved by DCIS, ER 0% PR 0%, 1.5 cm   10/28/2013 - 11/22/2013 Radiation Therapy Adjuvant radiation therapy    CHIEF COMPLIANT: Follow-up of ER/PR negative DCIS  INTERVAL HISTORY: Sophia Bryant is a 49 year old lady with above-mentioned history of DCIS of the left breast treated with lumpectomy and is currently on surveillance and observation. She underwent radiation therapy and is currently on observation. She reports that she is very anxious and worried that this DCIS or invasive breast cancer will come back.  REVIEW OF SYSTEMS:   Constitutional: Denies fevers, chills or abnormal weight loss Eyes: Denies blurriness of vision Ears, nose, mouth, throat, and face: Denies mucositis or sore throat Respiratory: Denies cough, dyspnea or wheezes Cardiovascular: Denies palpitation, chest discomfort or lower extremity swelling Gastrointestinal:  Denies nausea, heartburn or change in bowel habits Skin: Denies abnormal skin rashes Lymphatics: Denies new lymphadenopathy or easy bruising Neurological:Denies numbness, tingling or new weaknesses Behavioral/Psych: Mood is stable, no new changes  Breast:  denies any pain or lumps or nodules in either breasts All other systems were reviewed with the patient and are negative.  I have reviewed the past medical history, past surgical history, social history and family history with the  patient and they are unchanged from previous note.  ALLERGIES:  is allergic to erythromycin.  MEDICATIONS:  No current outpatient prescriptions on file.   No current facility-administered medications for this visit.    PHYSICAL EXAMINATION: ECOG PERFORMANCE STATUS: 1 - Symptomatic but completely ambulatory  Filed Vitals:   04/18/14 1110  BP: 106/69  Pulse: 82  Temp: 98.1 F (36.7 C)  Resp: 18   Filed Weights   04/18/14 1110  Weight: 137 lb 12.8 oz (62.506 kg)    GENERAL:alert, no distress and comfortable SKIN: skin color, texture, turgor are normal, no rashes or significant lesions EYES: normal, Conjunctiva are pink and non-injected, sclera clear OROPHARYNX:no exudate, no erythema and lips, buccal mucosa, and tongue normal  NECK: supple, thyroid normal size, non-tender, without nodularity LYMPH:  no palpable lymphadenopathy in the cervical, axillary or inguinal LUNGS: clear to auscultation and percussion with normal breathing effort HEART: regular rate & rhythm and no murmurs and no lower extremity edema ABDOMEN:abdomen soft, non-tender and normal bowel sounds Musculoskeletal:no cyanosis of digits and no clubbing  NEURO: alert & oriented x 3 with fluent speech, no focal motor/sensory deficits BREAST: No palpable masses or nodules in either right or left breasts. No palpable axillary supraclavicular or infraclavicular adenopathy no breast tenderness or nipple discharge. (exam performed in the presence of a chaperone)  LABORATORY DATA:  I have reviewed the data as listed   Chemistry      Component Value Date/Time   NA 139 10/08/2013 0653   K 4.6 10/08/2013 0653   CL 103 10/08/2013 0653   CO2 26 10/08/2013 0653   BUN 14 10/08/2013 0653   CREATININE 0.65 10/08/2013 3016  Component Value Date/Time   CALCIUM 9.4 10/08/2013 0653       Lab Results  Component Value Date   WBC 9.4 10/08/2013   HGB 14.4 10/08/2013   HCT 42.5 10/08/2013   MCV 90.0 10/08/2013    PLT 269 10/08/2013    ASSESSMENT & PLAN:  DCIS high-grade ER/PR negative with comedonecrosis diagnosed 09/25/2013 treated with lumpectomy, currently on observation surveillance.  Breast cancer surveillance: 1. Mammogram and ultrasound on the right breast March 2016 revealed a cluster of cysts otherwise normal. 2. Annual mammograms to be done in June 2016. 3. Breast chest wall axillary examination 04/18/2014 is normal.  Patient is extremely stressed out about risk of recurrence because it is triple negative and there was evidence of necrosis. Her sister-in-law was also diagnosed apparently with DCIS ER/PR negative and was treated at cancer treatment centers of [place] with chemotherapy. I provided her with a copy of NCCN guidelines that reveal no role of chemotherapy.  Return to clinic in 6 months for follow-up.  No orders of the defined types were placed in this encounter.   The patient has a good understanding of the overall plan. she agrees with it. She will call with any problems that may develop before her next visit here.   Rulon Eisenmenger, MD

## 2014-04-21 ENCOUNTER — Ambulatory Visit: Payer: BC Managed Care – PPO | Admitting: Hematology and Oncology

## 2014-05-08 ENCOUNTER — Other Ambulatory Visit: Payer: BLUE CROSS/BLUE SHIELD

## 2014-05-08 ENCOUNTER — Ambulatory Visit (HOSPITAL_BASED_OUTPATIENT_CLINIC_OR_DEPARTMENT_OTHER): Payer: BLUE CROSS/BLUE SHIELD | Admitting: Genetic Counselor

## 2014-05-08 ENCOUNTER — Encounter: Payer: Self-pay | Admitting: Genetic Counselor

## 2014-05-08 DIAGNOSIS — Z853 Personal history of malignant neoplasm of breast: Secondary | ICD-10-CM

## 2014-05-08 DIAGNOSIS — Z801 Family history of malignant neoplasm of trachea, bronchus and lung: Secondary | ICD-10-CM | POA: Diagnosis not present

## 2014-05-08 DIAGNOSIS — Z808 Family history of malignant neoplasm of other organs or systems: Secondary | ICD-10-CM

## 2014-05-08 DIAGNOSIS — Z315 Encounter for genetic counseling: Secondary | ICD-10-CM

## 2014-05-08 DIAGNOSIS — Z803 Family history of malignant neoplasm of breast: Secondary | ICD-10-CM | POA: Diagnosis not present

## 2014-05-08 DIAGNOSIS — Z809 Family history of malignant neoplasm, unspecified: Secondary | ICD-10-CM

## 2014-05-08 DIAGNOSIS — C50412 Malignant neoplasm of upper-outer quadrant of left female breast: Secondary | ICD-10-CM

## 2014-05-08 NOTE — Progress Notes (Signed)
Patient Name: Sophia Bryant Patient Age: 49 y.o. Encounter Date: 05/08/2014  Referring Physician: Nicholas Lose, MD  Primary Care Provider: No PCP Per Patient   Sophia Bryant, a 49 y.o. female, is being seen at the [facility] due to a personal and family history of breast cancer. She presents to clinic today with her daughter to discuss the possibility of a hereditary predisposition to cancer and discuss whether genetic testing is warranted.  HISTORY OF PRESENT ILLNESS: Sophia Bryant was diagnosed with left breast cancer(DCIS) at the age of 47. She is s/p lumpectomy and radiation.  The breast tumor was ER negative and PR negative.    Breast cancer of upper-outer quadrant of left female breast   08/27/2013 Initial Diagnosis Cancer of left breast, stage 0    09/25/2013 Surgery Left breast lumpectomy: High-grade DCIS with necrosis and calcifications, anterior margin involved by DCIS, ER 0% PR 0%, 1.5 cm   10/28/2013 - 11/22/2013 Radiation Therapy Adjuvant radiation therapy    Past Medical History  Diagnosis Date  . Smokes   . Postmenopausal     greater than a yr since menses-2015  . Breast cancer   . Family history of breast cancer     Past Surgical History  Procedure Laterality Date  . Breast enhancement surgery  05/1998  . Breast implant removal  2005    bilat  . Breast lumpectomy with needle localization Left 09/25/2013    Procedure: BREAST LUMPECTOMY LEFT NEEDLE LOCALIZATION;  Surgeon: Harl Bowie, MD;  Location: [place];  Service: General;  Laterality: Left;  . Re-excision of breast cancer,superior margins Left 10/08/2013    Procedure: RE-EXCISION LEFT BREAST DCIS;  Surgeon: Coralie Keens, MD;  Location: [place];  Service: General;  Laterality: Left;    History   Social History  . Marital Status: Married    Spouse Name: N/A  . Number of Children: N/A  . Years of Education: N/A   Social History Main Topics  . Smoking status: Current Every Day  Smoker -- 0.50 packs/day  . Smokeless tobacco: Not on file  . Alcohol Use: Yes     Comment: Social once a month  . Drug Use: No  . Sexual Activity: Not on file   Other Topics Concern  . Not on file   Social History Narrative     FAMILY HISTORY:   During the visit, a 4-generation pedigree was obtained. Family tree will be sent for scanning and will be in EPIC under the Media tab.  Significant diagnoses include the following:  Family History  Problem Relation Age of Onset  . Cancer Father 68    unk. primary ; deceased 69  . Throat cancer Maternal Uncle     smoker; deceased 81s  . Lung cancer Paternal Uncle     smoker; deceased 40  . Breast cancer Cousin 45    pat 1st cousin; daughter of uncle w/ lung ca    Additionally, has a son (age 58) and a daughter (age 63). She has 2 full sisters (ages 36 and 41) and one full brother (age 4). She also has 3 maternal half-brothers and 2 maternal half-sisters.  Her mother died at 65, cancer-free. She had 2 sisters and 3 other brothers. Her father had 3 sisters and another brother. There are no cancers reported in any grandparents, although she has no information about her paternal grandparents.  Sophia Bryant ancestry is [place] and Native American. There is no known Jewish ancestry and  no consanguinity.  ASSESSMENT AND PLAN: Sophia Bryant is a 49 y.o. female with a personal and family history of breast cancer. This history is not highly suggestive of a hereditary predisposition to cancer. Sophia Bryant is interested in testing as it would provide useful information for her and her famaily. We reviewed the characteristics, features and inheritance patterns of hereditary cancer syndromes. We also discussed genetic testing, including the appropriate family members to test, the process of testing, insurance coverage and implications of results. A negative result will be reassuring.  Sophia Bryant wished to pursue genetic testing and a blood sample will be sent  to [facility] for analysis of the 17 genes on the BreastNext gene panel (ATM, BARD1, BRCA1, BRCA2, BRIP1, CDH1, CHEK2, MRE11A, MUTYH, NBN, NF1, PALB2, PTEN, RAD50, RAD51C, RAD51D, and TP53). We discussed the implications of a positive, negative and/ or Variant of Uncertain Significance (VUS) result. Results should be available in approximately 4 weeks, at which point we will contact her and address implications for her as well as address genetic testing for at-risk family members, if needed.    We encouraged Sophia Bryant to remain in contact with Cancer Genetics annually so that we can update the family history and inform her of any changes in cancer genetics and testing that may be of benefit for this family. Ms.  Bryant questions were answered to her satisfaction today.   Thank you for the referral and allowing [place] to share in the care of your patient.   The patient was seen for a total of 30 minutes, greater than 50% of which was spent face-to-face counseling. This patient was discussed with the overseeing provider who agrees with the above.   Steele Berg, MS, [place] Certified Genetic Counselor phone: [phone] [email or web address]

## 2014-05-26 ENCOUNTER — Encounter: Payer: Self-pay | Admitting: Genetic Counselor

## 2014-05-26 DIAGNOSIS — Z1379 Encounter for other screening for genetic and chromosomal anomalies: Secondary | ICD-10-CM

## 2014-05-26 NOTE — Progress Notes (Signed)
GENETIC TEST RESULTS  Patient Name: Sophia Bryant Patient Age: 49 y.o. Encounter Date: 05/26/2014  Referring Physician: Nicholas Lose, MD   [name] was called today to discuss genetic test results. Please see the Genetics note from her visit on 05/08/14 for a detailed discussion of her personal and family history.  GENETIC TESTING: At the time of [name] visit, we recommended she pursue genetic testing of multiple genes on the BreastNext gene panel. This test, which included sequencing and deletion/duplication analysis of 17 genes, was performed at [facility]. Testing was normal and did not reveal a mutation in these genes. The genes tested were ATM, BARD1, BRCA1, BRCA2, BRIP1, CDH1, CHEK2, MRE11A, MUTYH, NBN, NF1, PALB2, PTEN, RAD50, RAD51C, RAD51D, and TP53.  We discussed with [name] that since the current test is not perfect, it is possible there may be a gene mutation that current testing cannot detect, but that chance is small. We also discussed that it is possible that a different genetic factor, which was not part of this testing or has not yet been discovered, is responsible for the cancer diagnoses in the family. Should [name] wish to discuss or pursue this additional testing, we are happy to coordinate this at any time, but do not feel that she is at significant risk of harboring a mutation in a different gene.     CANCER SCREENING: This result suggests that [name]' cancer was most likely not due to an inherited predisposition. Most cancers happen by chance and this negative test, along with details of her family history, suggests that her cancer falls into this category. We, therefore, recommended she continue to follow the cancer screening guidelines provided by her physician.   FAMILY MEMBERS: Women in the family are at some increased risk of developing breast cancer, over the general population risk, simply due to the family history. We recommended they have a yearly  mammogram beginning at age 107, which is 10 years earlier than the youngest breast cancer, a yearly clinical breast exam, and perform monthly breast self-exams. A gynecologic exam is recommended yearly. Colon cancer screening is recommended to begin by age 2.  Lastly, we discussed with [name] that cancer genetics is a rapidly advancing field and it is possible that new genetic tests will be appropriate for her in the future. We encouraged her to remain in contact with [place] on an annual basis so we can update her personal and family histories, and let her know of advances in cancer genetics that may benefit the family. Our contact number was provided. [name]' questions were answered to her satisfaction today, and she knows she is welcome to call anytime with additional questions.    Steele Berg, MS, [place] Certified Genetic Counselor phone: [phone] [email or web address]

## 2014-07-18 ENCOUNTER — Other Ambulatory Visit: Payer: Self-pay | Admitting: Hematology and Oncology

## 2014-07-18 DIAGNOSIS — N6320 Unspecified lump in the left breast, unspecified quadrant: Secondary | ICD-10-CM

## 2014-08-08 ENCOUNTER — Other Ambulatory Visit: Payer: Self-pay | Admitting: Hematology and Oncology

## 2014-08-08 ENCOUNTER — Ambulatory Visit
Admission: RE | Admit: 2014-08-08 | Discharge: 2014-08-08 | Disposition: A | Discharge status: Home / Self Care | Payer: BLUE CROSS/BLUE SHIELD | Source: Ambulatory Visit | Attending: Hematology and Oncology | Admitting: Hematology and Oncology

## 2014-08-08 DIAGNOSIS — N6320 Unspecified lump in the left breast, unspecified quadrant: Secondary | ICD-10-CM

## 2014-10-20 ENCOUNTER — Ambulatory Visit: Payer: BLUE CROSS/BLUE SHIELD | Admitting: Hematology and Oncology

## 2014-10-23 NOTE — Assessment & Plan Note (Signed)
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  Breast cancer surveillance: 1. Mammogram and ultrasound on the right breast March 2016 revealed a cluster of cysts otherwise normal. 2. Annual mammograms 08/08/2014: Right breast cyst 7 x 4 x 4 mm 3. Breast chest wall axillary examination 10/23/2014 is normal.  Patient is extremely stressed out about risk of recurrence because it is triple negative and there was evidence of necrosis. Her sister-in-law was also diagnosed apparently with DCIS ER/PR negative and was treated at cancer treatment centers of [place] with chemotherapy. I provided her with a copy of NCCN guidelines that reveal no role of chemotherapy.  Return to clinic in 6 months for follow-up.

## 2014-10-24 ENCOUNTER — Ambulatory Visit (HOSPITAL_BASED_OUTPATIENT_CLINIC_OR_DEPARTMENT_OTHER): Payer: BLUE CROSS/BLUE SHIELD | Admitting: Hematology and Oncology

## 2014-10-24 ENCOUNTER — Telehealth: Payer: Self-pay | Admitting: Hematology and Oncology

## 2014-10-24 ENCOUNTER — Encounter: Payer: Self-pay | Admitting: Hematology and Oncology

## 2014-10-24 VITALS — BP 100/50 | HR 75 | Temp 97.8°F | Resp 18 | Ht 63.0 in | Wt 130.2 lb

## 2014-10-24 DIAGNOSIS — C50412 Malignant neoplasm of upper-outer quadrant of left female breast: Secondary | ICD-10-CM

## 2014-10-24 NOTE — Telephone Encounter (Signed)
Appointments made and avs printed for patient °

## 2014-10-24 NOTE — Progress Notes (Signed)
Patient Care Team: No Pcp Per Patient as PCP - General (General Practice)  DIAGNOSIS: Breast cancer of upper-outer quadrant of left female breast   Staging form: Breast, AJCC 7th Edition     Clinical: No stage assigned - Unsigned     Pathologic: Stage 0 (Tis, N0, cM0) - Signed by Rulon Eisenmenger, MD on 10/18/2013   SUMMARY OF ONCOLOGIC HISTORY:   Breast cancer of upper-outer quadrant of left female breast   08/27/2013 Initial Diagnosis Cancer of left breast, stage 0    09/25/2013 Surgery Left breast lumpectomy: High-grade DCIS with necrosis and calcifications, anterior margin involved by DCIS, ER 0% PR 0%, 1.5 cm   10/28/2013 - 11/22/2013 Radiation Therapy Adjuvant radiation therapy    CHIEF COMPLIANT: Follow-up of DCIS  INTERVAL HISTORY: Sophia Bryant is a 49 year old with above-mentioned history of left breast DCIS. She is here for routine follow-up. She complains that from the left nipple she has noticed some crusting and discharge. She doesn't have it today. But she had shown it to her gynecologist. She does not have any pain or discomfort. She also complains of some bumps on her forearm. These appear to be like lipomas that are very small.  REVIEW OF SYSTEMS:   Constitutional: Denies fevers, chills or abnormal weight loss Eyes: Denies blurriness of vision Ears, nose, mouth, throat, and face: Denies mucositis or sore throat Respiratory: Denies cough, dyspnea or wheezes Cardiovascular: Denies palpitation, chest discomfort or lower extremity swelling Gastrointestinal:  Denies nausea, heartburn or change in bowel habits Skin: Denies abnormal skin rashes Lymphatics: Denies new lymphadenopathy or easy bruising Neurological:Denies numbness, tingling or new weaknesses Behavioral/Psych: Mood is stable, no new changes  Breast:  denies any pain or lumps or nodules in either breasts All other systems were reviewed with the patient and are negative.  I have reviewed the past medical history, past  surgical history, social history and family history with the patient and they are unchanged from previous note.  ALLERGIES:  is allergic to erythromycin.  MEDICATIONS:  No current outpatient prescriptions on file.   No current facility-administered medications for this visit.    PHYSICAL EXAMINATION: ECOG PERFORMANCE STATUS: 1 - Symptomatic but completely ambulatory  Filed Vitals:   10/24/14 0951  BP: 100/50  Pulse: 75  Temp: 97.8 F (36.6 C)  Resp: 18   Filed Weights   10/24/14 0951  Weight: 130 lb 3.2 oz (59.058 kg)    GENERAL:alert, no distress and comfortable SKIN: skin color, texture, turgor are normal, no rashes or significant lesions EYES: normal, Conjunctiva are pink and non-injected, sclera clear OROPHARYNX:no exudate, no erythema and lips, buccal mucosa, and tongue normal  NECK: supple, thyroid normal size, non-tender, without nodularity LYMPH:  no palpable lymphadenopathy in the cervical, axillary or inguinal LUNGS: clear to auscultation and percussion with normal breathing effort HEART: regular rate & rhythm and no murmurs and no lower extremity edema ABDOMEN:abdomen soft, non-tender and normal bowel sounds Musculoskeletal:no cyanosis of digits and no clubbing  NEURO: alert & oriented x 3 with fluent speech, no focal motor/sensory deficits BREAST: No palpable masses or nodules in either right or left breasts. No palpable axillary supraclavicular or infraclavicular adenopathy no breast tenderness or nipple discharge. I do not see any skin or nipple discharge. 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  Breast cancer surveillance: 1. Mammogram and ultrasound on the right breast March 2016 revealed a cluster of cysts otherwise normal. 2. Annual mammograms 08/08/2014: Right breast cyst 7 x 4 x 4 mm 3. Breast chest wall axillary examination 10/23/2014 is normal.  Patient was previously extremely stressed out about risk of recurrence because it is triple negative and there was evidence of necrosis. Her sister-in-law was also diagnosed apparently with DCIS ER/PR negative and was treated at cancer treatment centers of [place] with chemotherapy. We previously  provided her with a copy of NCCN guidelines that reveal no role of chemotherapy.  Return to clinic in 1 year for follow-up.  No orders of the defined types were placed in this encounter.   The patient has a good understanding of the overall plan. she agrees with it. she will call with any problems that may develop before the next visit here.   Rulon Eisenmenger, MD

## 2015-01-30 ENCOUNTER — Encounter: Payer: Self-pay | Admitting: *Deleted

## 2015-01-30 NOTE — Progress Notes (Signed)
Received office notes from [place], reviewed by Dr. Lindi Adie, sent to scan.

## 2015-07-27 ENCOUNTER — Other Ambulatory Visit: Payer: Self-pay | Admitting: Hematology and Oncology

## 2015-07-27 DIAGNOSIS — N63 Unspecified lump in unspecified breast: Secondary | ICD-10-CM

## 2015-08-14 ENCOUNTER — Ambulatory Visit
Admission: RE | Admit: 2015-08-14 | Discharge: 2015-08-14 | Disposition: A | Discharge status: Home / Self Care | Payer: PRIVATE HEALTH INSURANCE | Source: Ambulatory Visit | Attending: Hematology and Oncology | Admitting: Hematology and Oncology

## 2015-08-14 DIAGNOSIS — N63 Unspecified lump in unspecified breast: Secondary | ICD-10-CM

## 2015-10-23 ENCOUNTER — Ambulatory Visit (HOSPITAL_BASED_OUTPATIENT_CLINIC_OR_DEPARTMENT_OTHER): Payer: PRIVATE HEALTH INSURANCE | Admitting: Hematology and Oncology

## 2015-10-23 VITALS — BP 110/66 | HR 83 | Temp 98.1°F | Resp 18 | Wt 134.9 lb

## 2015-10-23 DIAGNOSIS — R0781 Pleurodynia: Secondary | ICD-10-CM

## 2015-10-23 DIAGNOSIS — D0591 Unspecified type of carcinoma in situ of right breast: Secondary | ICD-10-CM

## 2015-10-23 DIAGNOSIS — R0789 Other chest pain: Secondary | ICD-10-CM | POA: Diagnosis not present

## 2015-10-23 DIAGNOSIS — C50412 Malignant neoplasm of upper-outer quadrant of left female breast: Secondary | ICD-10-CM

## 2015-10-23 DIAGNOSIS — Z853 Personal history of malignant neoplasm of breast: Secondary | ICD-10-CM

## 2015-10-23 DIAGNOSIS — N6009 Solitary cyst of unspecified breast: Secondary | ICD-10-CM

## 2015-10-23 NOTE — Progress Notes (Signed)
Patient Care Team: No Pcp Per Patient as PCP - General (General Practice)  DIAGNOSIS: Breast cancer of upper-outer quadrant of left female breast ([place])   Staging form: Breast, AJCC 7th Edition   - Clinical: No stage assigned - Unsigned   - Pathologic: Stage 0 (Tis, N0, cM0) - Signed by Rulon Eisenmenger, MD on 10/18/2013  SUMMARY OF ONCOLOGIC HISTORY:   Breast cancer of upper-outer quadrant of left female breast ([place])   08/27/2013 Initial Diagnosis    Cancer of left breast, stage 0       09/25/2013 Surgery    Left breast lumpectomy: High-grade DCIS with necrosis and calcifications, anterior margin involved by DCIS, ER 0% PR 0%, 1.5 cm      10/28/2013 - 11/22/2013 Radiation Therapy    Adjuvant radiation therapy       CHIEF COMPLIANT: Follow-up of DCIS  INTERVAL HISTORY: Sophia Bryant is a 50 year old with above-mentioned history of high-grade DCIS involving the left breast who is currently on observation. She complains of breast being sore and uncomfortable and sensitive to touch she is also has some muscle ache in between the ribs below the breast. She continues to have intermittent nipple discharge and crusting around the nipple.  REVIEW OF SYSTEMS:   Constitutional: Denies fevers, chills or abnormal weight loss Eyes: Denies blurriness of vision Ears, nose, mouth, throat, and face: Denies mucositis or sore throat Respiratory: Denies cough, dyspnea or wheezes Cardiovascular: Denies palpitation, chest discomfort Gastrointestinal:  Denies nausea, heartburn or change in bowel habits Skin: Denies abnormal skin rashes Lymphatics: Denies new lymphadenopathy or easy bruising Neurological:Denies numbness, tingling or new weaknesses Behavioral/Psych: Mood is stable, no new changes  Extremities: No lower extremity edema Breast: Nipple discharge and sensitive breast All other systems were reviewed with the patient and are negative.  I have reviewed the past medical history, past surgical  history, social history and family history with the patient and they are unchanged from previous note.  ALLERGIES:  is allergic to erythromycin.  MEDICATIONS:  No current outpatient prescriptions on file.   No current facility-administered medications for this visit.     PHYSICAL EXAMINATION: ECOG PERFORMANCE STATUS: 1 - Symptomatic but completely ambulatory  Vitals:   10/23/15 1014  BP: 110/66  Pulse: 83  Resp: 18  Temp: 98.1 F (36.7 C)   Filed Weights   10/23/15 1014  Weight: 134 lb 14.4 oz (61.2 kg)    GENERAL:alert, no distress and comfortable SKIN: skin color, texture, turgor are normal, no rashes or significant lesions EYES: normal, Conjunctiva are pink and non-injected, sclera clear OROPHARYNX:no exudate, no erythema and lips, buccal mucosa, and tongue normal  NECK: supple, thyroid normal size, non-tender, without nodularity LYMPH:  no palpable lymphadenopathy in the cervical, axillary or inguinal LUNGS: clear to auscultation and percussion with normal breathing effort HEART: regular rate & rhythm and no murmurs and no lower extremity edema ABDOMEN:abdomen soft, non-tender and normal bowel sounds MUSCULOSKELETAL:no cyanosis of digits and no clubbing  NEURO: alert & oriented x 3 with fluent speech, no focal motor/sensory deficits EXTREMITIES: No lower extremity edema BREAST: No palpable masses or nodules in either right or left breasts. No palpable axillary supraclavicular or infraclavicular adenopathy no breast tenderness or nipple discharge. 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  Breast cancer surveillance: 1. Mammogram and ultrasound July 2017 revealed a cluster of cysts otherwise normal. 2. Breast chest wall axillary examination 10/23/2014 is normal.  Left rib and chest wall pain: Will obtain a CT of the chest with contrast 7 call her with the results of this test.  Return to clinic in 1 year for surveillance checkup and follow-up with survivorship clinic   Orders Placed This Encounter  Procedures  . CT Chest W Contrast    Standing Status:   Future    Standing Expiration Date:   10/22/2016    Order Specific Question:   If indicated for the ordered procedure, I authorize the administration of contrast media per Radiology protocol    Answer:   Yes    Order Specific Question:   Reason for Exam (SYMPTOM  OR DIAGNOSIS REQUIRED)    Answer:   Left rib and chest pain with H/O Left Breast DCIS    Order Specific Question:   Is patient pregnant?    Answer:   No    Order Specific Question:   Preferred imaging location?    Answer:   [facility]  . Amb Referral to Survivorship Long term    Referral Priority:   Routine    Referral Type:   Consultation    Number of Visits Requested:   1   The patient has a good understanding of the overall plan. she agrees with it. she will call with any problems that may develop before the next visit here.   Rulon Eisenmenger, MD 10/24/15

## 2015-10-23 NOTE — Assessment & Plan Note (Signed)
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  Breast cancer surveillance: 1. Mammogram and ultrasound July 2017 revealed a cluster of cysts otherwise normal. 2. Breast chest wall axillary examination 10/23/2014 is normal.  Left rib and chest wall pain: Will obtain a CT of the chest with contrast 7 call her with the results of this test.  Return to clinic in 1 year for surveillance checkup and follow-up with survivorship clinic

## 2015-10-24 ENCOUNTER — Encounter: Payer: Self-pay | Admitting: Hematology and Oncology

## 2015-11-06 ENCOUNTER — Ambulatory Visit (HOSPITAL_COMMUNITY)
Admission: RE | Admit: 2015-11-06 | Discharge: 2015-11-06 | Disposition: A | Discharge status: Home / Self Care | Payer: PRIVATE HEALTH INSURANCE | Source: Ambulatory Visit | Attending: Hematology and Oncology | Admitting: Hematology and Oncology

## 2015-11-06 ENCOUNTER — Encounter (HOSPITAL_COMMUNITY): Payer: Self-pay

## 2015-11-06 DIAGNOSIS — I700 Atherosclerosis of aorta: Secondary | ICD-10-CM | POA: Diagnosis not present

## 2015-11-06 DIAGNOSIS — R0789 Other chest pain: Secondary | ICD-10-CM | POA: Insufficient documentation

## 2015-11-06 DIAGNOSIS — R911 Solitary pulmonary nodule: Secondary | ICD-10-CM | POA: Insufficient documentation

## 2015-11-06 DIAGNOSIS — J432 Centrilobular emphysema: Secondary | ICD-10-CM | POA: Insufficient documentation

## 2015-11-06 MED ORDER — IOPAMIDOL (ISOVUE-300) INJECTION 61%
75.0000 mL | Freq: Once | INTRAVENOUS | Status: AC | PRN
Start: 2015-11-06 — End: 2015-11-06
  Administered 2015-11-06: 75 mL via INTRAVENOUS

## 2015-11-09 ENCOUNTER — Telehealth: Payer: Self-pay | Admitting: *Deleted

## 2015-11-09 ENCOUNTER — Encounter: Payer: Self-pay | Admitting: Hematology and Oncology

## 2015-11-09 NOTE — Telephone Encounter (Signed)
Notified patient that Dr Lindi Adie reviewed CT scan. Looks ok, did not observe a cause for the left rib pain, very small nodule noted on left upper lobe. Per Dr Lindi Adie, too small to be concerned about.  Pt verbalized understanding

## 2015-11-10 ENCOUNTER — Encounter: Payer: Self-pay | Admitting: Hematology and Oncology

## 2016-08-12 ENCOUNTER — Other Ambulatory Visit: Payer: Self-pay | Admitting: Hematology and Oncology

## 2016-08-12 DIAGNOSIS — Z853 Personal history of malignant neoplasm of breast: Secondary | ICD-10-CM

## 2016-08-12 DIAGNOSIS — Z9889 Other specified postprocedural states: Secondary | ICD-10-CM

## 2016-08-19 ENCOUNTER — Ambulatory Visit
Admission: RE | Admit: 2016-08-19 | Discharge: 2016-08-19 | Disposition: A | Discharge status: Home / Self Care | Payer: PRIVATE HEALTH INSURANCE | Source: Ambulatory Visit | Attending: Hematology and Oncology | Admitting: Hematology and Oncology

## 2016-08-19 DIAGNOSIS — Z853 Personal history of malignant neoplasm of breast: Secondary | ICD-10-CM

## 2016-08-19 DIAGNOSIS — Z9889 Other specified postprocedural states: Secondary | ICD-10-CM

## 2016-08-19 HISTORY — DX: Personal history of irradiation: Z92.3

## 2016-10-21 ENCOUNTER — Encounter: Payer: PRIVATE HEALTH INSURANCE | Admitting: Adult Health

## 2016-11-04 ENCOUNTER — Telehealth: Payer: Self-pay

## 2016-11-04 ENCOUNTER — Ambulatory Visit (HOSPITAL_BASED_OUTPATIENT_CLINIC_OR_DEPARTMENT_OTHER): Payer: PRIVATE HEALTH INSURANCE | Admitting: Adult Health

## 2016-11-04 ENCOUNTER — Encounter: Payer: Self-pay | Admitting: Adult Health

## 2016-11-04 ENCOUNTER — Encounter: Payer: Self-pay | Admitting: Internal Medicine

## 2016-11-04 VITALS — BP 103/50 | HR 72 | Temp 97.9°F | Resp 17 | Ht 63.0 in | Wt 142.5 lb

## 2016-11-04 DIAGNOSIS — Z1211 Encounter for screening for malignant neoplasm of colon: Secondary | ICD-10-CM

## 2016-11-04 DIAGNOSIS — C50412 Malignant neoplasm of upper-outer quadrant of left female breast: Secondary | ICD-10-CM

## 2016-11-04 DIAGNOSIS — Z853 Personal history of malignant neoplasm of breast: Secondary | ICD-10-CM

## 2016-11-04 DIAGNOSIS — R911 Solitary pulmonary nodule: Secondary | ICD-10-CM

## 2016-11-04 DIAGNOSIS — R200 Anesthesia of skin: Secondary | ICD-10-CM

## 2016-11-04 DIAGNOSIS — Z171 Estrogen receptor negative status [ER-]: Principal | ICD-10-CM

## 2016-11-04 DIAGNOSIS — R918 Other nonspecific abnormal finding of lung field: Secondary | ICD-10-CM

## 2016-11-04 NOTE — Telephone Encounter (Signed)
Printed avs and calender with upcoming appointment. per 10/5 los

## 2016-11-04 NOTE — Progress Notes (Signed)
CLINIC:  Survivorship   REASON FOR VISIT:  Routine follow-up for history of breast cancer.   BRIEF ONCOLOGIC HISTORY:    Breast cancer of upper-outer quadrant of left female breast (Sophia Bryant)   08/27/2013 Initial Diagnosis    Cancer of left breast, stage 0       09/25/2013 Surgery    Left breast lumpectomy: High-grade DCIS with necrosis and calcifications, anterior margin involved by DCIS, ER 0% PR 0%, 1.5 cm      10/28/2013 - 11/22/2013 Radiation Therapy    Adjuvant radiation therapy        INTERVAL HISTORY:  Sophia Bryant presents to the [facility] today for routine follow-up for her history of breast cancer.  Overall, she reports feeling quite well. She does have some numbness in her breasts.  She does not have a PCP.  She does not exercise.  She does see a gynecologist annually.      REVIEW OF SYSTEMS:  Review of Systems  Constitutional: Negative for appetite change, chills, fatigue, fever and unexpected weight change.  HENT:   Negative for hearing loss and lump/mass.   Eyes: Negative for eye problems and icterus.  Respiratory: Negative for chest tightness, cough and shortness of breath.   Cardiovascular: Negative for chest pain and leg swelling.  Gastrointestinal: Negative for abdominal distention, abdominal pain, constipation, diarrhea, nausea and vomiting.  Endocrine: Negative for hot flashes.  Genitourinary: Negative for difficulty urinating.   Skin: Negative for itching and rash.  Neurological: Negative for dizziness, extremity weakness and light-headedness.  Hematological: Negative for adenopathy. Does not bruise/bleed easily.  Psychiatric/Behavioral: Negative for depression. The patient is not nervous/anxious.    Breast: Denies any new nodularity, masses, tenderness, nipple changes, or nipple discharge.       PAST MEDICAL/SURGICAL HISTORY:  Past Medical History:  Diagnosis Date  . Breast cancer (Sophia Bryant)   . Family history of breast cancer   . Personal  history of radiation therapy   . Postmenopausal    greater than a yr since menses-2015  . Smokes    Past Surgical History:  Procedure Laterality Date  . BREAST ENHANCEMENT SURGERY  05/1998  . BREAST IMPLANT REMOVAL  2005   bilat  . BREAST LUMPECTOMY Left 09/25/2013  . BREAST LUMPECTOMY WITH NEEDLE LOCALIZATION Left 09/25/2013   Procedure: BREAST LUMPECTOMY LEFT NEEDLE LOCALIZATION;  Surgeon: Harl Bowie, MD;  Location: [facility];  Service: General;  Laterality: Left;  . RE-EXCISION OF BREAST CANCER,SUPERIOR MARGINS Left 10/08/2013   Procedure: RE-EXCISION LEFT BREAST DCIS;  Surgeon: Coralie Keens, MD;  Location: [facility];  Service: General;  Laterality: Left;     ALLERGIES:  Allergies  Allergen Reactions  . Erythromycin Hives and Itching     CURRENT MEDICATIONS:  No outpatient encounter prescriptions on file as of 11/04/2016.   No facility-administered encounter medications on file as of 11/04/2016.      ONCOLOGIC FAMILY HISTORY:  Family History  Problem Relation Age of Onset  . Cancer Father 46       unk. primary ; deceased 82  . Throat cancer Maternal Uncle        smoker; deceased 49s  . Lung cancer Paternal Uncle        smoker; deceased 76  . Breast cancer Cousin 61       pat 1st cousin; daughter of uncle w/ lung ca    GENETIC COUNSELING/TESTING: Testing was normal and did not reveal a mutation. The genes tested were ATM, BARD1,  BRCA1, BRCA2, BRIP1, CDH1, CHEK2, MRE11A, MUTYH, NBN, NF1, PALB2, PTEN, RAD50, RAD51C, RAD51D, and TP53.  SOCIAL HISTORY:  Sophia Bryant is married and lives with her husband in [place], [place].  She has 2 children and they live in [place], [place] as well.  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  She denies any current or history of  Alcohol or illicit drug use.  She is currently smoking three cigarettes per day. She has a 25 pack year tobacco history.     PHYSICAL EXAMINATION:  Vital  Signs: Vitals:   11/04/16 0846  BP: (!) 103/50  Pulse: 72  Resp: 17  Temp: 97.9 F (36.6 C)  SpO2: 100%   Filed Weights   11/04/16 0846  Weight: 142 lb 8 oz (64.6 kg)   General: Well-nourished, well-appearing female in no acute distress.  Unaccompanied today.   HEENT: Head is normocephalic.  Pupils equal and reactive to light. Conjunctivae clear without exudate.  Sclerae anicteric. Oral mucosa is pink, moist.  Oropharynx is pink without lesions or erythema.  Lymph: No cervical, supraclavicular, or infraclavicular lymphadenopathy noted on palpation.  Cardiovascular: Regular rate and rhythm.Marland Kitchen Respiratory: Clear to auscultation bilaterally. Chest expansion symmetric; breathing non-labored.  Breast Exam:  -Left breast: No appreciable masses on palpation. No skin redness, thickening, or peau d'orange appearance; no nipple retraction or nipple discharge; mild distortion in symmetry at previous lumpectomy site well healed scar without erythema or nodularity.  -Right breast: No appreciable masses on palpation. No skin redness, thickening, or peau d'orange appearance; no nipple retraction or nipple discharge;  -Axilla: No axillary adenopathy bilaterally.  GI: Abdomen soft and round; non-tender, non-distended. Bowel sounds normoactive. No hepatosplenomegaly.   GU: Deferred.  Neuro: No focal deficits. Steady gait.  Psych: Mood and affect normal and appropriate for situation.  MSK: No focal spinal tenderness to palpation, full range of motion in bilateral upper extremities Extremities: No edema. Skin: Warm and dry.  LABORATORY DATA:  None for this visit   DIAGNOSTIC IMAGING:  Most recent mammogram:     ASSESSMENT AND PLAN:  Ms.. Bryant is a pleasant 51 y.o. 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  She presents to the [facility] for surveillance and routine follow-up.     1. History of breast cancer:  Sophia Bryant is currently clinically and radiographically without evidence of disease or recurrence of breast cancer. She will be due for mammogram in 07/2017; orders placed today.  I encouraged her to call me with any questions or concerns before her next visit at the cancer center, and I would be happy to see her sooner, if needed.    2.  Lung nodule on CT and current every day smoker: Patient did undergo CT chest one year ago and it demonstrated a 15m pulmonary nodule.  Due to her smoking history and the nodule, she is considered high risk.  I ordered a repeat chest CT without contrast today to evaluate.    3. Bone health:  Given Ms. GBallahistory of breast cancer, she is at risk for bone demineralization.  She was given education on specific food and activities to promote bone health.  4. Cancer screening:  Due to Sophia Bryant's history and her age, she should receive screening for skin cancers, colon cancer, and gynecologic cancers. She was encouraged to follow-up with her PCP for appropriate cancer screenings.   5. Health maintenance  and wellness promotion: Sophia Bryant was encouraged to consume 5-7 servings of fruits and vegetables per day. She was also encouraged to engage in moderate to vigorous exercise for 30 minutes per day most days of the week. She was instructed to limit her alcohol consumption and continue to abstain from tobacco use.    Dispo:  -Return to cancer center in one year for LTS follow up -Chest CT this month -Mammogram in 07/2016   A total of (30) minutes of face-to-face time was spent with this patient with greater than 50% of that time in counseling and care-coordination.   Gardenia Phlegm, NP Survivorship Program [place] [phone]   Note: PRIMARY CARE PROVIDER Patient, No Pcp Per None None

## 2016-11-17 ENCOUNTER — Ambulatory Visit (HOSPITAL_COMMUNITY): Payer: PRIVATE HEALTH INSURANCE

## 2016-12-30 ENCOUNTER — Other Ambulatory Visit: Payer: Self-pay

## 2016-12-30 ENCOUNTER — Ambulatory Visit (AMBULATORY_SURGERY_CENTER): Payer: Self-pay

## 2016-12-30 VITALS — Ht 64.0 in | Wt 144.0 lb

## 2016-12-30 DIAGNOSIS — Z1211 Encounter for screening for malignant neoplasm of colon: Secondary | ICD-10-CM

## 2016-12-30 NOTE — Progress Notes (Signed)
Denies allergies to eggs or soy products. Denies complication of anesthesia or sedation. Denies use of weight loss medication. Denies use of O2.   Emmi instructions declined.  

## 2017-01-13 ENCOUNTER — Ambulatory Visit (AMBULATORY_SURGERY_CENTER): Payer: PRIVATE HEALTH INSURANCE | Admitting: Internal Medicine

## 2017-01-13 ENCOUNTER — Other Ambulatory Visit: Payer: Self-pay

## 2017-01-13 ENCOUNTER — Encounter: Payer: Self-pay | Admitting: Internal Medicine

## 2017-01-13 VITALS — BP 119/74 | HR 74 | Temp 98.0°F | Resp 13 | Ht 64.0 in | Wt 144.0 lb

## 2017-01-13 DIAGNOSIS — Z1211 Encounter for screening for malignant neoplasm of colon: Secondary | ICD-10-CM | POA: Diagnosis present

## 2017-01-13 DIAGNOSIS — D122 Benign neoplasm of ascending colon: Secondary | ICD-10-CM | POA: Diagnosis not present

## 2017-01-13 MED ORDER — SODIUM CHLORIDE 0.9 % IV SOLN
500.0000 mL | INTRAVENOUS | Status: AC
Start: 2017-01-13 — End: ?

## 2017-01-13 NOTE — Op Note (Signed)
[place] Patient Name: Sophia Bryant Procedure Date: 01/13/2017 9:07 AM MRN: [id number] Endoscopist: Gatha Mayer , MD Age: 51 Referring MD:  Date of Birth: 1965-11-29 Gender: Female Account #: [id number] Procedure:                Colonoscopy Indications:              Screening for colorectal malignant neoplasm, This                            is the patient's first colonoscopy Medicines:                Propofol per Anesthesia, Monitored Anesthesia Care Procedure:                Pre-Anesthesia Assessment:                           - Prior to the procedure, a History and Physical                            was performed, and patient medications and                            allergies were reviewed. The patient's tolerance of                            previous anesthesia was also reviewed. The risks                            and benefits of the procedure and the sedation                            options and risks were discussed with the patient.                            All questions were answered, and informed consent                            was obtained. Prior Anticoagulants: The patient has                            taken no previous anticoagulant or antiplatelet                            agents. ASA Grade Assessment: II - A patient with                            mild systemic disease. After reviewing the risks                            and benefits, the patient was deemed in                            satisfactory condition to undergo the procedure.  After obtaining informed consent, the colonoscope                            was passed under direct vision. Throughout the                            procedure, the patient's blood pressure, pulse, and                            oxygen saturations were monitored continuously. The                            Model CF-HQ190L [id number]) scope was introduced   through the anus and advanced to the the cecum,                            identified by appendiceal orifice and ileocecal                            valve. The colonoscopy was performed without                            difficulty. The patient tolerated the procedure                            well. The quality of the bowel preparation was                            excellent. The ileocecal valve, appendiceal                            orifice, and rectum were photographed. The bowel                            preparation used was Miralax. Scope In: 9:18:28 AM Scope Out: 9:32:29 AM Scope Withdrawal Time: 0 hours 11 minutes 58 seconds  Total Procedure Duration: 0 hours 14 minutes 1 second  Findings:                 The perianal and digital rectal examinations were                            normal.                           Two sessile polyps were found in the ascending                            colon. The polyps were 1 to 2 mm in size. These                            polyps were removed with a cold biopsy forceps.                            Resection and retrieval were  complete. Verification                            of patient identification for the specimen was                            done. Estimated blood loss was minimal.                           The exam was otherwise without abnormality on                            direct and retroflexion views. Complications:            No immediate complications. Estimated Blood Loss:     Estimated blood loss was minimal. Impression:               - Two 1 to 2 mm polyps in the ascending colon,                            removed with a cold biopsy forceps. Resected and                            retrieved.                           - The examination was otherwise normal on direct                            and retroflexion views. Recommendation:           - Patient has a contact number available for                             emergencies. The signs and symptoms of potential                            delayed complications were discussed with the                            patient. Return to normal activities tomorrow.                            Written discharge instructions were provided to the                            patient.                           - Resume previous diet.                           - Continue present medications.                           - Repeat colonoscopy is recommended. The  colonoscopy date will be determined after pathology                            results from today's exam become available for                            review. Gatha Mayer, MD 01/13/2017 9:37:46 AM This report has been signed electronically.

## 2017-01-13 NOTE — Progress Notes (Signed)
To PACU, VSS. Report to [email or web address] 

## 2017-01-13 NOTE — Progress Notes (Signed)
Called to room to assist during endoscopic procedure.  Patient ID and intended procedure confirmed with present staff. Received instructions for my participation in the procedure from the performing physician.  

## 2017-01-13 NOTE — Progress Notes (Signed)
Pt's states no medical or surgical changes since previsit or office visit. 

## 2017-01-13 NOTE — Patient Instructions (Addendum)
I found and removed 2 very tiny polyps. I will let you know pathology results and when to have another routine colonoscopy by mail and/or My Chart.  All else ok.  I appreciate the opportunity to care for you. Gatha Mayer, MD, [facility]    Information on polyps given to you today   YOU HAD AN ENDOSCOPIC PROCEDURE TODAY AT [place]:   Refer to the procedure report that was given to you for any specific questions about what was found during the examination.  If the procedure report does not answer your questions, please call your gastroenterologist to clarify.  If you requested that your care partner not be given the details of your procedure findings, then the procedure report has been included in a sealed envelope for you to review at your convenience later.  YOU SHOULD EXPECT: Some feelings of bloating in the abdomen. Passage of more gas than usual.  Walking can help get rid of the air that was put into your GI tract during the procedure and reduce the bloating. If you had a lower endoscopy (such as a colonoscopy or flexible sigmoidoscopy) you may notice spotting of blood in your stool or on the toilet paper. If you underwent a bowel prep for your procedure, you may not have a normal bowel movement for a few days.  Please Note:  You might notice some irritation and congestion in your nose or some drainage.  This is from the oxygen used during your procedure.  There is no need for concern and it should clear up in a day or so.  SYMPTOMS TO REPORT IMMEDIATELY:   Following lower endoscopy (colonoscopy or flexible sigmoidoscopy):  Excessive amounts of blood in the stool  Significant tenderness or worsening of abdominal pains  Swelling of the abdomen that is new, acute  Fever of 100F or higher    For urgent or emergent issues, a gastroenterologist can be reached at any hour by calling [phone].   DIET:  We do recommend a small meal at first, but then you  may proceed to your regular diet.  Drink plenty of fluids but you should avoid alcoholic beverages for 24 hours.  ACTIVITY:  You should plan to take it easy for the rest of today and you should NOT DRIVE or use heavy machinery until tomorrow (because of the sedation medicines used during the test).    FOLLOW UP: Our staff will call the number listed on your records the next business day following your procedure to check on you and address any questions or concerns that you may have regarding the information given to you following your procedure. If we do not reach you, we will leave a message.  However, if you are feeling well and you are not experiencing any problems, there is no need to return our call.  We will assume that you have returned to your regular daily activities without incident.  If any biopsies were taken you will be contacted by phone or by letter within the next 1-3 weeks.  Please call us at [phone] if you have not heard about the biopsies in 3 weeks.    SIGNATURES/CONFIDENTIALITY: You and/or your care partner have signed paperwork which will be entered into your electronic medical record.  These signatures attest to the fact that that the information above on your After Visit Summary has been reviewed and is understood.  Full responsibility of the confidentiality of this discharge information lies with you  and/or your care-partner. 

## 2017-01-16 ENCOUNTER — Telehealth: Payer: Self-pay | Admitting: *Deleted

## 2017-01-16 NOTE — Telephone Encounter (Signed)
  Follow up Call-  Call back number 01/13/2017  Post procedure Call Back phone  # [phone]  Permission to leave phone message Yes  Some recent data might be hidden     Patient questions:  Do you have a fever, pain , or abdominal swelling? No. Pain Score  0 *  Have you tolerated food without any problems? Yes.    Have you been able to return to your normal activities? Yes.    Do you have any questions about your discharge instructions: Diet   No. Medications  No. Follow up visit  No.  Do you have questions or concerns about your Care? No.  Actions: * If pain score is 4 or above: No action needed, pain <4.

## 2017-01-17 ENCOUNTER — Encounter: Payer: Self-pay | Admitting: Internal Medicine

## 2017-01-17 DIAGNOSIS — Z860101 Personal history of adenomatous and serrated colon polyps: Secondary | ICD-10-CM

## 2017-01-17 DIAGNOSIS — Z86010 Personal history of colonic polyps: Secondary | ICD-10-CM

## 2017-01-17 HISTORY — DX: Personal history of adenomatous and serrated colon polyps: Z86.0101

## 2017-01-17 HISTORY — DX: Personal history of colonic polyps: Z86.010

## 2017-01-17 NOTE — Progress Notes (Signed)
2 diminutive adenomas Recall 2023 My Chart letter

## 2017-02-10 ENCOUNTER — Ambulatory Visit (HOSPITAL_COMMUNITY): Payer: PRIVATE HEALTH INSURANCE

## 2017-03-24 ENCOUNTER — Ambulatory Visit (HOSPITAL_COMMUNITY): Admission: RE | Admit: 2017-03-24 | Payer: PRIVATE HEALTH INSURANCE | Source: Ambulatory Visit

## 2017-06-03 IMAGING — CT CT CHEST W/ CM
2 of 3 series · 15 of 36 positions shown, 18 images · IV contrast (iopamidol)
Comparison: None.

CLINICAL DATA: Left rib pain for a month. History of left breast
cancer in 1488 with lumpectomy and radiation therapy. Tobacco use.

EXAM:
CT CHEST WITH CONTRAST
TECHNIQUE: Multidetector CT imaging of the chest was performed during
intravenous contrast administration.
CONTRAST:  75mL 97TC07-9DD IOPAMIDOL (97TC07-9DD) INJECTION 61%

[Series 2: chest with st · axial · 0.74mm/px · z∈[-293,-17]mm · 12 of 162 slices shown, 15 images]
[im 12/162  mediastinal]
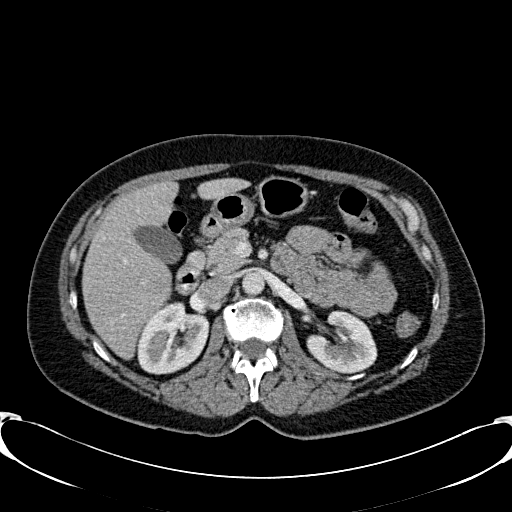
[im 12/162  lung]
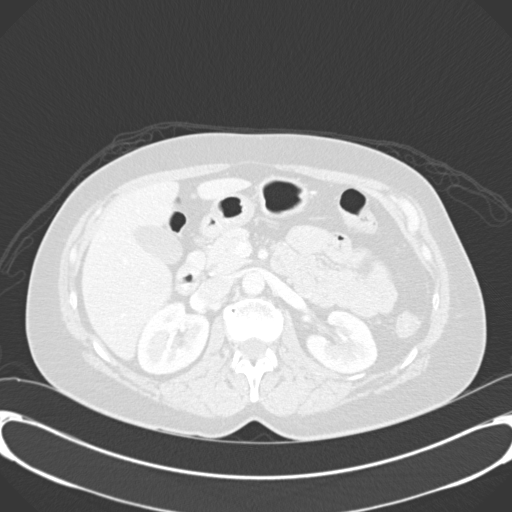
[im 24/162  lung]
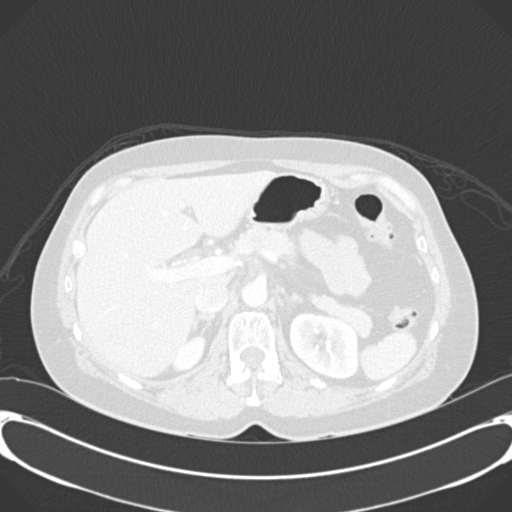
[im 36/162  lung]
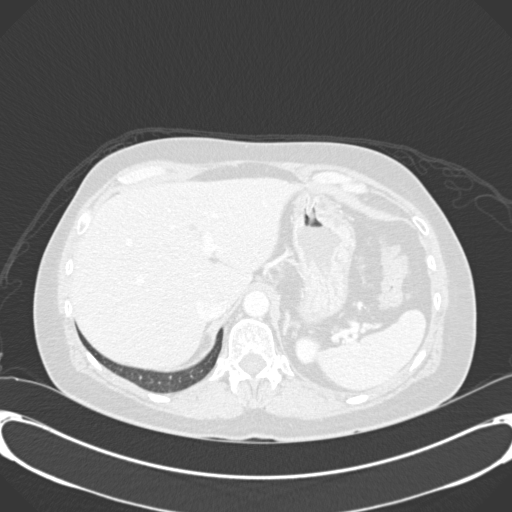
[im 48/162  lung]
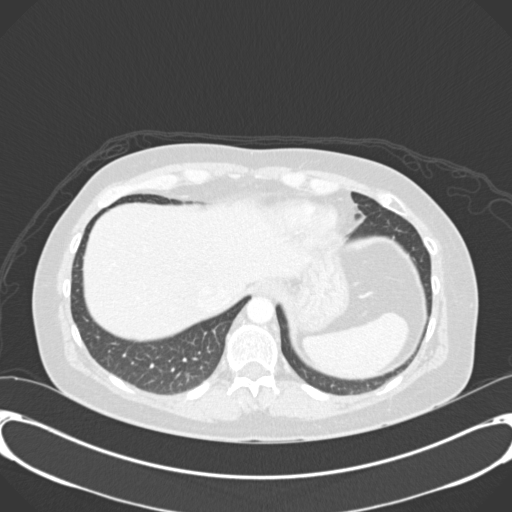
[im 60/162  mediastinal]
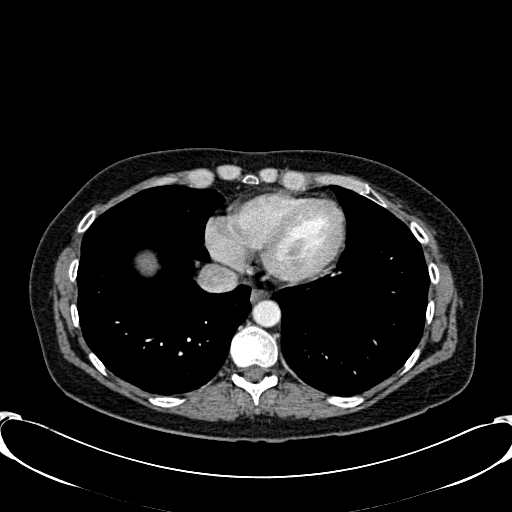
[im 60/162  lung]
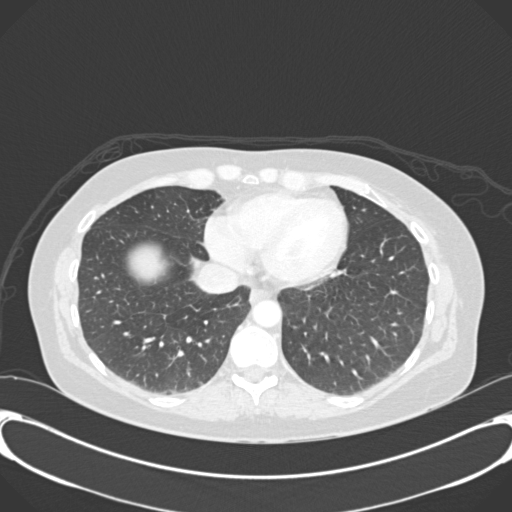
[im 72/162  lung]
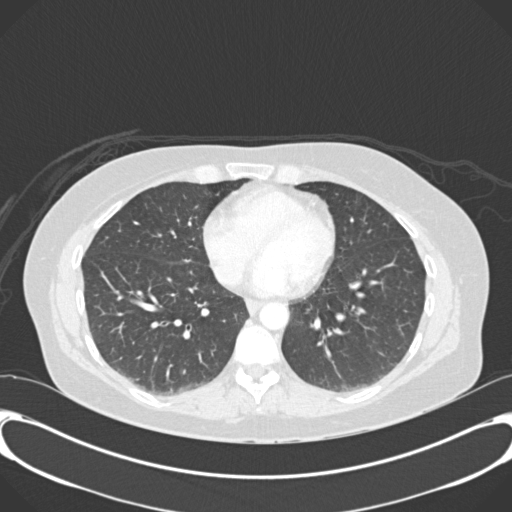
[im 90/162  lung]
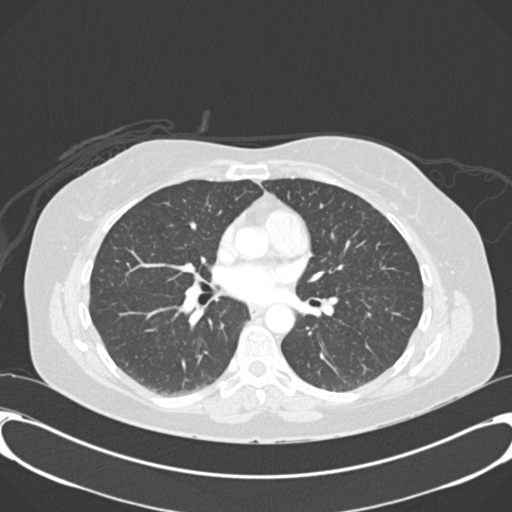
[im 102/162  lung]
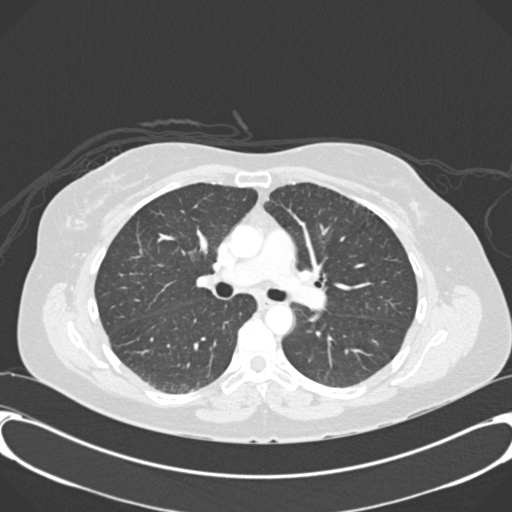
[im 114/162  mediastinal]
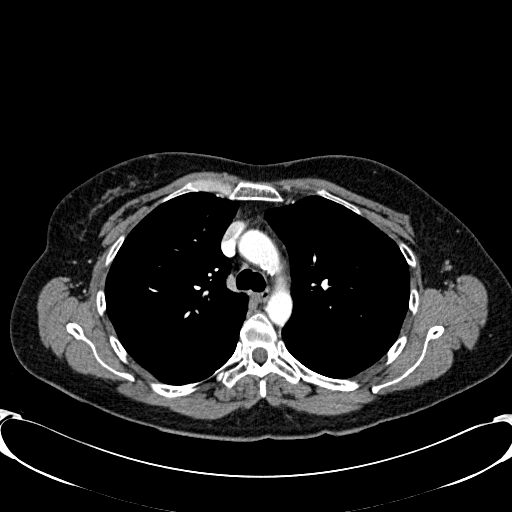
[im 114/162  lung]
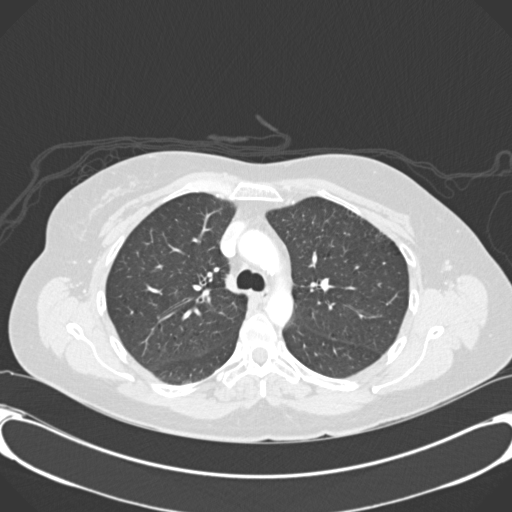
[im 126/162  lung]
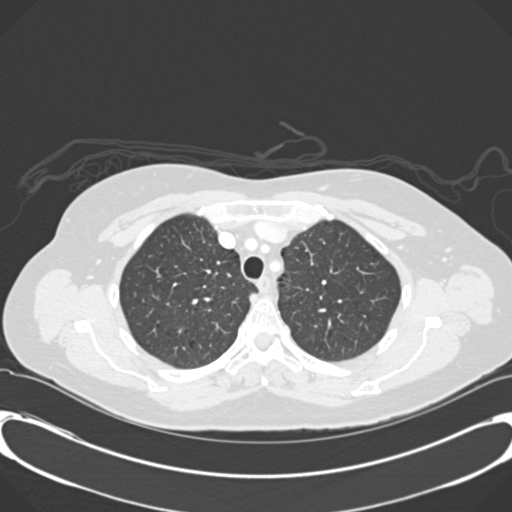
[im 138/162  lung]
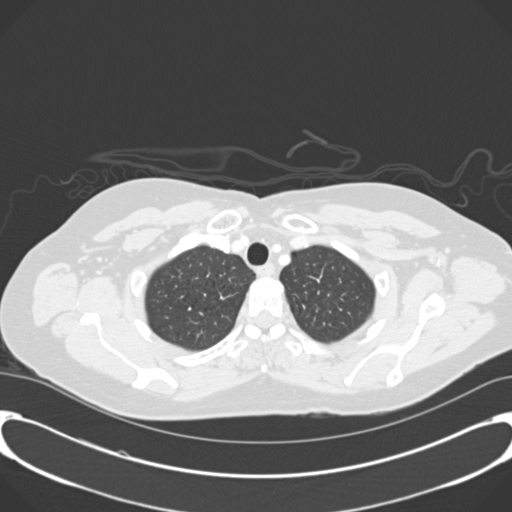
[im 150/162  lung]
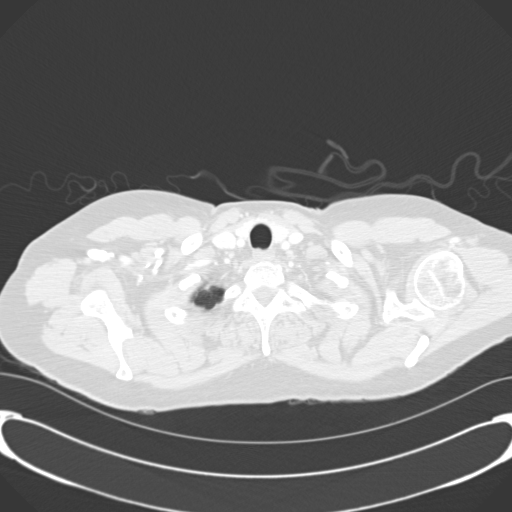

[Series 602: <mpr thick range> · coronal · 0.74mm/px · 3 of 117 slices shown]
[im 24/117  lung]
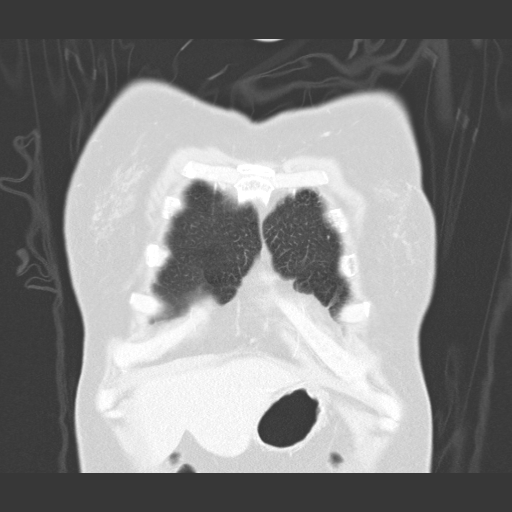
[im 47/117  lung]
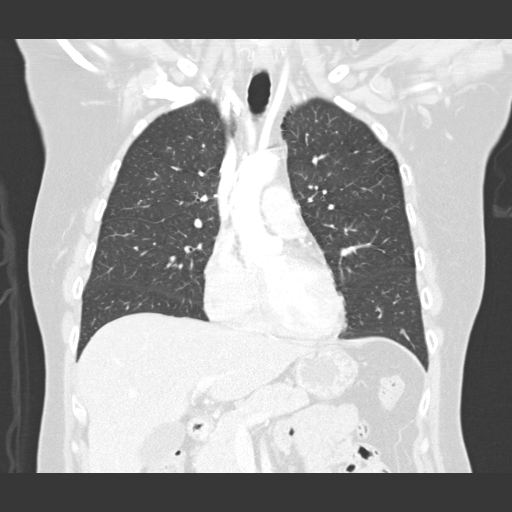
[im 70/117  lung]
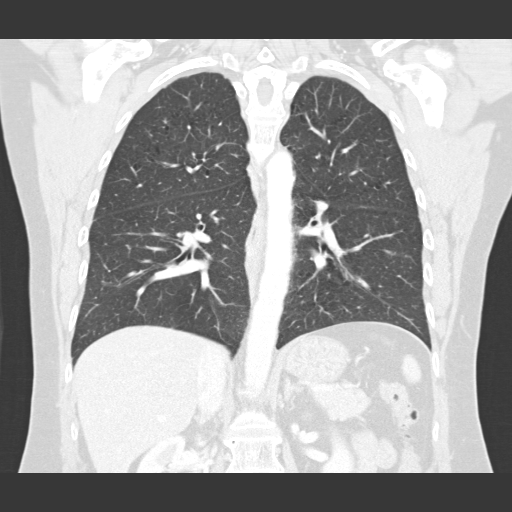

[15 of 36 positions shown; findings below may reference images not displayed]

FINDINGS: Cardiovascular: Atherosclerotic calcification of the aortic arch.
Heart size normal.

Mediastinum/Nodes: Unremarkable

Lungs/Pleura: Centrilobular emphysema. 3 mm subpleural lymph node
along the minor fissure, image 63/5. Mild bilateral airway
thickening. 3 mm left upper lobe pulmonary nodule, image 45/5.

Upper Abdomen: Unremarkable

Musculoskeletal: Unremarkable
IMPRESSION: 1. I do not observe a cause for the patient'  s left rib pain
2. Atherosclerotic aortic arch.
3. Centrilobular emphysema.
4. Airway thickening is present, suggesting bronchitis or reactive
airways disease.
5. 3 mm left upper lobe pulmonary nodule. No follow-up needed if
patient is low-risk. Non-contrast chest CT can be considered in 12
months if patient is high-risk. This recommendation follows the
consensus statement: Guidelines for Management of Incidental
Pulmonary Nodules Detected on CT Images: From the [HOSPITAL]

## 2017-08-25 ENCOUNTER — Ambulatory Visit
Admission: RE | Admit: 2017-08-25 | Discharge: 2017-08-25 | Disposition: A | Discharge status: Home / Self Care | Payer: PRIVATE HEALTH INSURANCE | Source: Ambulatory Visit | Attending: Adult Health | Admitting: Adult Health

## 2017-08-25 DIAGNOSIS — Z171 Estrogen receptor negative status [ER-]: Principal | ICD-10-CM

## 2017-08-25 DIAGNOSIS — C50412 Malignant neoplasm of upper-outer quadrant of left female breast: Secondary | ICD-10-CM

## 2017-11-03 ENCOUNTER — Telehealth: Payer: Self-pay | Admitting: Adult Health

## 2017-11-03 ENCOUNTER — Encounter: Payer: Self-pay | Admitting: Adult Health

## 2017-11-03 ENCOUNTER — Inpatient Hospital Stay: Payer: PRIVATE HEALTH INSURANCE | Attending: Nurse Practitioner | Admitting: Adult Health

## 2017-11-03 VITALS — BP 99/53 | HR 73 | Temp 98.5°F | Resp 18 | Ht 64.0 in | Wt 146.1 lb

## 2017-11-03 DIAGNOSIS — F17210 Nicotine dependence, cigarettes, uncomplicated: Secondary | ICD-10-CM | POA: Diagnosis not present

## 2017-11-03 DIAGNOSIS — Z171 Estrogen receptor negative status [ER-]: Secondary | ICD-10-CM

## 2017-11-03 DIAGNOSIS — C50412 Malignant neoplasm of upper-outer quadrant of left female breast: Secondary | ICD-10-CM

## 2017-11-03 DIAGNOSIS — Z802 Family history of malignant neoplasm of other respiratory and intrathoracic organs: Secondary | ICD-10-CM | POA: Diagnosis not present

## 2017-11-03 DIAGNOSIS — Z923 Personal history of irradiation: Secondary | ICD-10-CM | POA: Diagnosis not present

## 2017-11-03 DIAGNOSIS — Z800 Family history of malignant neoplasm of digestive organs: Secondary | ICD-10-CM | POA: Diagnosis not present

## 2017-11-03 DIAGNOSIS — Z853 Personal history of malignant neoplasm of breast: Secondary | ICD-10-CM

## 2017-11-03 DIAGNOSIS — E2839 Other primary ovarian failure: Secondary | ICD-10-CM

## 2017-11-03 DIAGNOSIS — Z803 Family history of malignant neoplasm of breast: Secondary | ICD-10-CM

## 2017-11-03 DIAGNOSIS — Z801 Family history of malignant neoplasm of trachea, bronchus and lung: Secondary | ICD-10-CM | POA: Insufficient documentation

## 2017-11-03 NOTE — Telephone Encounter (Signed)
Gave pt avs and calendar  °

## 2017-11-03 NOTE — Progress Notes (Signed)
CLINIC:  Survivorship   REASON FOR VISIT:  Routine follow-up for history of breast cancer.   BRIEF ONCOLOGIC HISTORY:    Breast cancer of upper-outer quadrant of left female breast ([place])   08/27/2013 Initial Diagnosis    Cancer of left breast, stage 0     09/25/2013 Surgery    Left breast lumpectomy: High-grade DCIS with necrosis and calcifications, anterior margin involved by DCIS, ER 0% PR 0%, 1.5 cm    10/28/2013 - 11/22/2013 Radiation Therapy    Adjuvant radiation therapy      INTERVAL HISTORY:  Sophia Bryant presents to the [facility] today for routine follow-up for her history of breast cancer.  Overall, she reports feeling quite well.   Sophia Bryant did undergo colonoscopy in 12/2017 and adenomatous polyps were found.  She will repeat colonoscopy in 5 years.  She was going to undergo a CT chest, and pay out of pocket for it, however decided not to due to cost.  She is a former smoker of 1.5ppd x 34 years =  51 pack year tobacco history.  She is planning on starting low dose CT for lung cancer screening at age 24.  Mammogram in 07/2017 showed a breast density of b and no evidence of malignancy.  She continues to see GYN regularly.  She has not had a skin cancer screening.    Sophia Bryant is not getting much exercise.  She works 11 hours per day, is off on Fridays, has a brother who was diagnosed with stage III colon cancer, so she is helping him.     REVIEW OF SYSTEMS:  Review of Systems  Constitutional: Negative for appetite change, chills, fatigue, fever and unexpected weight change.  HENT:   Negative for hearing loss, lump/mass and trouble swallowing.   Eyes: Negative for eye problems and icterus.  Respiratory: Negative for cough and shortness of breath.   Cardiovascular: Negative for chest pain, leg swelling and palpitations.  Gastrointestinal: Negative for abdominal distention, abdominal pain, blood in stool, constipation, diarrhea, nausea and vomiting.  Endocrine: Negative for  hot flashes.  Musculoskeletal: Negative for arthralgias.  Skin: Negative for itching and rash.  Neurological: Negative for dizziness, extremity weakness and numbness.  Hematological: Negative for adenopathy. Does not bruise/bleed easily.  Psychiatric/Behavioral: Negative for depression. The patient is not nervous/anxious.    Breast: Denies any new nodularity, masses, tenderness, nipple changes, or nipple discharge.       PAST MEDICAL/SURGICAL HISTORY:  Past Medical History:  Diagnosis Date  . Breast cancer ([place])   . Family history of breast cancer   . GERD (gastroesophageal reflux disease)   . Hx of adenomatous colonic polyps 01/17/2017  . Personal history of radiation therapy 2015  . Postmenopausal    greater than a yr since menses-2015  . Smokes    Past Surgical History:  Procedure Laterality Date  . BREAST ENHANCEMENT SURGERY  05/1998  . BREAST IMPLANT REMOVAL  2005   bilat  . BREAST LUMPECTOMY Left 09/25/2013  . BREAST LUMPECTOMY WITH NEEDLE LOCALIZATION Left 09/25/2013   Procedure: BREAST LUMPECTOMY LEFT NEEDLE LOCALIZATION;  Surgeon: Harl Bowie, MD;  Location: [facility];  Service: General;  Laterality: Left;  . RE-EXCISION OF BREAST CANCER,SUPERIOR MARGINS Left 10/08/2013   Procedure: RE-EXCISION LEFT BREAST DCIS;  Surgeon: Coralie Keens, MD;  Location: [facility];  Service: General;  Laterality: Left;     ALLERGIES:  Allergies  Allergen Reactions  . Erythromycin Hives and Itching     CURRENT  MEDICATIONS:  Outpatient Encounter Medications as of 11/03/2017  Medication Sig  . EQ NICOTINE 4 MG lozenge DISSOLVE 1 LOZENGE BY MOUTH 12 TIMES DAILY AS NEEDED TRY TO WEAN AS POSSIBLE  . [DISCONTINUED] ibuprofen (ADVIL,MOTRIN) 200 MG tablet Take 200 mg by mouth every 6 (six) hours as needed.  . [DISCONTINUED] OVER THE COUNTER MEDICATION Alka-seltzer chews as needed for heartburn and gas.   Facility-Administered Encounter Medications as of 11/03/2017    Medication  . 0.9 %  sodium chloride infusion     ONCOLOGIC FAMILY HISTORY:  Family History  Problem Relation Age of Onset  . Cancer Father 26       unk. primary ; deceased 38  . Throat cancer Maternal Uncle        smoker; deceased 34s  . Esophageal cancer Maternal Uncle   . Lung cancer Paternal Uncle        smoker; deceased 80  . Breast cancer Cousin 24       pat 1st cousin; daughter of uncle w/ lung ca  . Colon cancer Neg Hx   . Pancreatic cancer Neg Hx   . Rectal cancer Neg Hx   . Stomach cancer Neg Hx     GENETIC COUNSELING/TESTING: Tested in 2015  SOCIAL HISTORY:  Social History   Socioeconomic History  . Marital status: Married    Spouse name: Not on file  . Number of children: Not on file  . Years of education: Not on file  . Highest education level: Not on file  Occupational History  . Not on file  Social Needs  . Financial resource strain: Not on file  . Food insecurity:    Worry: Not on file    Inability: Not on file  . Transportation needs:    Medical: Not on file    Non-medical: Not on file  Tobacco Use  . Smoking status: Current Every Day Smoker    Packs/day: 0.50  . Smokeless tobacco: Never Used  Substance and Sexual Activity  . Alcohol use: Yes    Comment: Social once a month  . Drug use: No  . Sexual activity: Not on file  Lifestyle  . Physical activity:    Days per week: Not on file    Minutes per session: Not on file  . Stress: Not on file  Relationships  . Social connections:    Talks on phone: Not on file    Gets together: Not on file    Attends religious service: Not on file    Active member of club or organization: Not on file    Attends meetings of clubs or organizations: Not on file    Relationship status: Not on file  . Intimate partner violence:    Fear of current or ex partner: Not on file    Emotionally abused: Not on file    Physically abused: Not on file    Forced sexual activity: Not on file  Other Topics Concern   . Not on file  Social History Narrative  . Not on file     PHYSICAL EXAMINATION:  Vital Signs: Vitals:   11/03/17 0907  BP: (!) 99/53  Pulse: 73  Resp: 18  Temp: 98.5 F (36.9 C)  SpO2: 99%   Filed Weights   11/03/17 0907  Weight: 146 lb 1.6 oz (66.3 kg)   General: Well-nourished, well-appearing female in no acute distress.  Unaccompanied today.   HEENT: Head is normocephalic.  Pupils equal and reactive to light. Conjunctivae  clear without exudate.  Sclerae anicteric. Oral mucosa is pink, moist.  Oropharynx is pink without lesions or erythema.  Lymph: No cervical, supraclavicular, or infraclavicular lymphadenopathy noted on palpation.  Cardiovascular: Regular rate and rhythm.Marland Kitchen Respiratory: Clear to auscultation bilaterally. Chest expansion symmetric; breathing non-labored.  Breast Exam:  -Left breast: No appreciable masses on palpation. No skin redness, thickening, or peau d'orange appearance; no nipple retraction or nipple discharge; mild distortion in symmetry at previous lumpectomy site well healed scar without erythema or nodularity.  -Right breast: No appreciable masses on palpation. No skin redness, thickening, or peau d'orange appearance; no nipple retraction or nipple discharge; . -Axilla: No axillary adenopathy bilaterally.  GI: Abdomen soft and round; non-tender, non-distended. Bowel sounds normoactive. No hepatosplenomegaly.   GU: Deferred.  Neuro: No focal deficits. Steady gait.  Psych: Mood and affect normal and appropriate for situation.  MSK: No focal spinal tenderness to palpation, full range of motion in bilateral upper extremities Extremities: No edema. Skin: Warm and dry.  LABORATORY DATA:  None for this visit   DIAGNOSTIC IMAGING:  Most recent mammogram:     ASSESSMENT AND PLAN:  Ms.. Bryant is a pleasant 52 y.o. 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  [facility] for [facility] and routine follow-up.   1. History of breast cancer:  Sophia Bryant is currently clinically and radiographically without evidence of disease or recurrence of breast cancer. She will be due for mammogram in 08/2018; orders placed today.  I will see her back in one year for continued surveillance.  I encouraged her to call me with any questions or concerns before her next visit at the cancer center, and I would be happy to see her sooner, if needed.    2. Bone health:  Given Sophia Bryant age, history of breast cancer and tobacco history, she is at risk for bone demineralization. She has not had a bone density done, so I ordered one to be done when she has her mammogram in July of next year.  She was given education on specific food and activities to promote bone health.  3. Cancer screening:  Due to Sophia Bryant's history and her age, she should receive screening for skin cancers, colon cancer, and gynecologic cancers. At age 55 she will be eligible for lung cancer screening with a low dose CT scan. She was encouraged to follow-up with her PCP for appropriate cancer screenings.   4. Health maintenance and wellness promotion: Sophia Bryant was encouraged to consume 5-7 servings of fruits and vegetables per day. She was also encouraged to engage in moderate to vigorous exercise for 30 minutes per day most days of the week. She was instructed to limit her alcohol consumption and continue to abstain from tobacco use.  I noted she hasn't had fasting labs with her PCP.  I recommended this be done annually.  Dispo:  -Return to cancer center in one year for LTS follow up -Mammogram in 08/2018 -Bone Density in 08/2018   A total of (30) minutes of face-to-face time was spent with this patient with greater than 50% of that time in counseling and care-coordination.   Gardenia Phlegm, NP Survivorship [facility] [place] [phone]   Note: PRIMARY CARE PROVIDER Patient, No Pcp Per None None

## 2017-11-06 ENCOUNTER — Telehealth: Payer: Self-pay

## 2017-11-06 NOTE — Telephone Encounter (Signed)
Spoke with pt concerning CRC testing per Roma Kayser. Pt has half brother who is 51 years old.  Pt voiced understanding of information given.

## 2017-11-06 NOTE — Telephone Encounter (Signed)
-----  Message from Gardenia Phlegm, NP sent at 11/03/2017 10:53 AM EDT ----- Please call and read what karen said to Aarion.    Thanks,  LC ----- Message ----- From: Clarene Essex, Counselor Sent: 11/03/2017  10:09 AM EDT To: Gardenia Phlegm, NP  Do you know how old her brother is that has Roosevelt? Is it one of her full or half brothers?    The genes that were tested were only those that are associated with breast cancer syndromes.  If she wants to update her testing to include the CRC genes, we can, but it may not be covered as the Ford is not consistent with a hereditary CRC syndrome unless the brother who has cancer is 89 or below.  It may be best to start with her brother.  If he is eligible for testing, we could test him and if he is positive then test her.  Santiago Glad ----- Message ----- From: Gardenia Phlegm, NP Sent: 11/03/2017   9:39 AM EDT To: Clarene Essex, Counselor  Patient brother has a colon cancer new diagnosis, she wants to know if she needs any updated genetic testing related to this.  She underwent testing in 2015 with the following genes.     The genes tested were ATM, BARD1, BRCA1, BRCA2, BRIP1, CDH1, CHEK2, MRE11A, MUTYH, NBN, NF1, PALB2, PTEN, RAD50, RAD51C, RAD51D, and TP53.    Can you let me know?  Thanks,  Mendel Ryder

## 2018-09-14 ENCOUNTER — Encounter: Payer: Self-pay | Admitting: Adult Health

## 2018-09-17 ENCOUNTER — Other Ambulatory Visit: Payer: Self-pay | Admitting: Adult Health

## 2018-09-17 DIAGNOSIS — R918 Other nonspecific abnormal finding of lung field: Secondary | ICD-10-CM

## 2018-09-21 ENCOUNTER — Ambulatory Visit (HOSPITAL_COMMUNITY)
Admission: RE | Admit: 2018-09-21 | Discharge: 2018-09-21 | Disposition: A | Discharge status: Home / Self Care | Payer: PRIVATE HEALTH INSURANCE | Source: Ambulatory Visit | Attending: Adult Health | Admitting: Adult Health

## 2018-09-21 ENCOUNTER — Other Ambulatory Visit: Payer: Self-pay

## 2018-09-21 ENCOUNTER — Other Ambulatory Visit: Payer: Self-pay | Admitting: Adult Health

## 2018-09-21 ENCOUNTER — Ambulatory Visit
Admission: RE | Admit: 2018-09-21 | Discharge: 2018-09-21 | Disposition: A | Discharge status: Home / Self Care | Payer: PRIVATE HEALTH INSURANCE | Source: Ambulatory Visit | Attending: Adult Health | Admitting: Adult Health

## 2018-09-21 DIAGNOSIS — R918 Other nonspecific abnormal finding of lung field: Secondary | ICD-10-CM | POA: Diagnosis not present

## 2018-09-21 DIAGNOSIS — C50412 Malignant neoplasm of upper-outer quadrant of left female breast: Secondary | ICD-10-CM

## 2018-09-21 DIAGNOSIS — Z171 Estrogen receptor negative status [ER-]: Secondary | ICD-10-CM

## 2018-09-21 DIAGNOSIS — E2839 Other primary ovarian failure: Secondary | ICD-10-CM

## 2018-09-25 ENCOUNTER — Telehealth: Payer: Self-pay | Admitting: Adult Health

## 2018-09-25 DIAGNOSIS — R911 Solitary pulmonary nodule: Secondary | ICD-10-CM

## 2018-09-25 NOTE — Telephone Encounter (Signed)
Called patient at [phone] and reviewed CT chest and bone density results.  I recommended she f/u with PCP about bone density.  I reviewed that there is a lung nodule noted on her CT scan and repeat scan is needed. Orders placed for December of this year.  Joory knows to call for any questions or concerns prior to her next appointment with [place].    Wilber Bihari, NP

## 2018-09-25 NOTE — Telephone Encounter (Signed)
Called and [facility] for patient to call me to discuss her recent bone density and CT chest results.  Wilber Bihari, NP

## 2018-11-02 ENCOUNTER — Other Ambulatory Visit: Payer: Self-pay

## 2018-11-02 ENCOUNTER — Telehealth: Payer: Self-pay | Admitting: Adult Health

## 2018-11-02 ENCOUNTER — Encounter: Payer: Self-pay | Admitting: Adult Health

## 2018-11-02 ENCOUNTER — Telehealth: Payer: Self-pay

## 2018-11-02 ENCOUNTER — Inpatient Hospital Stay: Payer: PRIVATE HEALTH INSURANCE | Attending: Adult Health | Admitting: Adult Health

## 2018-11-02 VITALS — BP 115/55 | HR 91 | Temp 98.7°F | Resp 17 | Ht 64.0 in | Wt 148.1 lb

## 2018-11-02 DIAGNOSIS — Z853 Personal history of malignant neoplasm of breast: Secondary | ICD-10-CM | POA: Diagnosis present

## 2018-11-02 DIAGNOSIS — Z803 Family history of malignant neoplasm of breast: Secondary | ICD-10-CM | POA: Diagnosis not present

## 2018-11-02 DIAGNOSIS — Z801 Family history of malignant neoplasm of trachea, bronchus and lung: Secondary | ICD-10-CM | POA: Insufficient documentation

## 2018-11-02 DIAGNOSIS — Z923 Personal history of irradiation: Secondary | ICD-10-CM | POA: Diagnosis not present

## 2018-11-02 DIAGNOSIS — C50412 Malignant neoplasm of upper-outer quadrant of left female breast: Secondary | ICD-10-CM | POA: Diagnosis not present

## 2018-11-02 DIAGNOSIS — R918 Other nonspecific abnormal finding of lung field: Secondary | ICD-10-CM | POA: Insufficient documentation

## 2018-11-02 DIAGNOSIS — Z171 Estrogen receptor negative status [ER-]: Secondary | ICD-10-CM

## 2018-11-02 DIAGNOSIS — F17210 Nicotine dependence, cigarettes, uncomplicated: Secondary | ICD-10-CM | POA: Diagnosis not present

## 2018-11-02 NOTE — Patient Instructions (Signed)

## 2018-11-02 NOTE — Telephone Encounter (Signed)
Per Mendel Ryder called patient and let her know that I scheduled her Chest CT for Monday December 7th @9 :30am and she is to arrive by 9:15am. Patient verbalized understanding. No further problems or concerns at this time.

## 2018-11-02 NOTE — Telephone Encounter (Signed)
Per Mendel Ryder called and scheduled patient for Chest CT. It is scheduled for December 7th @10am 

## 2018-11-02 NOTE — Telephone Encounter (Signed)
I talk with patient regarding schedule  

## 2018-11-02 NOTE — Progress Notes (Signed)
CLINIC:  Survivorship   REASON FOR VISIT:  Routine follow-up for history of breast cancer.   BRIEF ONCOLOGIC HISTORY:  Oncology History  Breast cancer of upper-outer quadrant of left female breast ([place])  08/27/2013 Initial Diagnosis   Cancer of left breast, stage 0    09/25/2013 Surgery   Left breast lumpectomy: High-grade DCIS with necrosis and calcifications, anterior margin involved by DCIS, ER 0% PR 0%, 1.5 cm   10/28/2013 - 11/22/2013 Radiation Therapy   Adjuvant radiation therapy      INTERVAL HISTORY:  Sophia Bryant presents to the [facility] today for routine follow-up for her history of breast cancer.  Overall, she reports feeling quite well.   Sophia Bryant is doing well.  She is seeing her PCP regularly. She is up to date with her colon cancer screening, and skin cancer screening.  She is a current every day smoker.  She  Underwent chest CT on 09/21/2018 to f/u lung nodule.  A new subsolid pulmonary nodule was found.  Repeat CT was recommended and was ordered for 01/2019.    She underwent bone density testing in 09/2018 that also showed osteoporosis with a T score of -2.5.   She also underwent mammogram and ultrasound in 09/2018 that showed no evidence of malignance, and benign cyst in the lower outer right breast.     REVIEW OF SYSTEMS:  Review of Systems  Constitutional: Negative for appetite change, chills, fatigue, fever and unexpected weight change.  HENT:   Negative for hearing loss, lump/mass and trouble swallowing.   Eyes: Negative for eye problems and icterus.  Respiratory: Negative for cough and shortness of breath.   Cardiovascular: Negative for chest pain, leg swelling and palpitations.  Gastrointestinal: Negative for abdominal distention, abdominal pain, blood in stool, constipation, diarrhea, nausea and vomiting.  Endocrine: Negative for hot flashes.  Genitourinary: Negative for difficulty urinating.   Musculoskeletal: Negative for arthralgias and back pain.   Skin: Negative for itching and rash.  Neurological: Negative for dizziness, extremity weakness and numbness.  Hematological: Negative for adenopathy. Does not bruise/bleed easily.  Psychiatric/Behavioral: Negative for depression. The patient is not nervous/anxious.    Breast: Denies any new nodularity, masses, tenderness, nipple changes, or nipple discharge.       PAST MEDICAL/SURGICAL HISTORY:  Past Medical History:  Diagnosis Date  . Breast cancer ([place])   . Family history of breast cancer   . GERD (gastroesophageal reflux disease)   . Hx of adenomatous colonic polyps 01/17/2017  . Personal history of radiation therapy 2015  . Postmenopausal    greater than a yr since menses-2015  . Smokes    Past Surgical History:  Procedure Laterality Date  . BREAST ENHANCEMENT SURGERY  05/1998  . BREAST IMPLANT REMOVAL  2005   bilat  . BREAST LUMPECTOMY Left 09/25/2013  . BREAST LUMPECTOMY WITH NEEDLE LOCALIZATION Left 09/25/2013   Procedure: BREAST LUMPECTOMY LEFT NEEDLE LOCALIZATION;  Surgeon: Harl Bowie, MD;  Location: [facility];  Service: General;  Laterality: Left;  . RE-EXCISION OF BREAST CANCER,SUPERIOR MARGINS Left 10/08/2013   Procedure: RE-EXCISION LEFT BREAST DCIS;  Surgeon: Coralie Keens, MD;  Location: [place];  Service: General;  Laterality: Left;     ALLERGIES:  Allergies  Allergen Reactions  . Erythromycin Hives and Itching     CURRENT MEDICATIONS:  Outpatient Encounter Medications as of 11/02/2018  Medication Sig  . EQ NICOTINE 4 MG lozenge DISSOLVE 1 LOZENGE BY MOUTH 12 TIMES DAILY AS NEEDED TRY TO  WEAN AS POSSIBLE   Facility-Administered Encounter Medications as of 11/02/2018  Medication  . 0.9 %  sodium chloride infusion     ONCOLOGIC FAMILY HISTORY:  Family History  Problem Relation Age of Onset  . Cancer Father 26       unk. primary ; deceased 44, found in liver  . Throat cancer Maternal Uncle        smoker; deceased 36s  .  Esophageal cancer Maternal Uncle   . Lung cancer Paternal Uncle        smoker; deceased 72  . Breast cancer Cousin 29       pat 1st cousin; daughter of uncle w/ lung ca  . Colon cancer Brother   . Pancreatic cancer Neg Hx   . Rectal cancer Neg Hx   . Stomach cancer Neg Hx     GENETIC COUNSELING/TESTING: Tested in 2015  SOCIAL HISTORY:  Social History   Socioeconomic History  . Marital status: Married    Spouse name: Not on file  . Number of children: Not on file  . Years of education: Not on file  . Highest education level: Not on file  Occupational History  . Not on file  Social Needs  . Financial resource strain: Not on file  . Food insecurity    Worry: Not on file    Inability: Not on file  . Transportation needs    Medical: Not on file    Non-medical: Not on file  Tobacco Use  . Smoking status: Current Every Day Smoker    Packs/day: 0.50  . Smokeless tobacco: Never Used  Substance and Sexual Activity  . Alcohol use: Yes    Comment: Social once a month  . Drug use: No  . Sexual activity: Not on file  Lifestyle  . Physical activity    Days per week: Not on file    Minutes per session: Not on file  . Stress: Not on file  Relationships  . Social Herbalist on phone: Not on file    Gets together: Not on file    Attends religious service: Not on file    Active member of club or organization: Not on file    Attends meetings of clubs or organizations: Not on file    Relationship status: Not on file  . Intimate partner violence    Fear of current or ex partner: Not on file    Emotionally abused: Not on file    Physically abused: Not on file    Forced sexual activity: Not on file  Other Topics Concern  . Not on file  Social History Narrative  . Not on file     PHYSICAL EXAMINATION:  Vital Signs: Vitals:   11/02/18 1501  BP: (!) 115/55  Pulse: 91  Resp: 17  Temp: 98.7 F (37.1 C)  SpO2: 100%   Filed Weights   11/02/18 1501  Weight:  148 lb 1.6 oz (67.2 kg)   General: Well-nourished, well-appearing female in no acute distress.  Unaccompanied today.   HEENT: Head is normocephalic.  Pupils equal and reactive to light. Conjunctivae clear without exudate.  Sclerae anicteric. Oral mucosa is pink, moist.  Oropharynx is pink without lesions or erythema.  Lymph: No cervical, supraclavicular, or infraclavicular lymphadenopathy noted on palpation.  Cardiovascular: Regular rate and rhythm.Marland Kitchen Respiratory: Clear to auscultation bilaterally. Chest expansion symmetric; breathing non-labored.  Breast Exam:  -Left breast: No appreciable masses on palpation. No skin redness, thickening, or peau  d'orange appearance; no nipple retraction or nipple discharge; mild distortion in symmetry at previous lumpectomy site well healed scar without erythema or nodularity.  -Right breast: No appreciable masses on palpation. No skin redness, thickening, or peau d'orange appearance; no nipple retraction or nipple discharge; . -Axilla: No axillary adenopathy bilaterally.  GI: Abdomen soft and round; non-tender, non-distended. Bowel sounds normoactive. No hepatosplenomegaly.   GU: Deferred.  Neuro: No focal deficits. Steady gait.  Psych: Mood and affect normal and appropriate for situation.  MSK: No focal spinal tenderness to palpation, full range of motion in bilateral upper extremities Extremities: No edema. Skin: Warm and dry.  LABORATORY DATA:  None for this visit   DIAGNOSTIC IMAGING:  Most recent mammogram:  CLINICAL DATA:  Malignant lumpectomy of the LEFT breast in 2015 with adjuvant radiation therapy. Annual evaluation.  EXAM: DIGITAL DIAGNOSTIC BILATERAL MAMMOGRAM WITH CAD AND TOMO  ULTRASOUND RIGHT BREAST  COMPARISON:  Previous exam(s).  ACR Breast Density Category b: There are scattered areas of fibroglandular density.  FINDINGS: Tomosynthesis and synthesized full field CC and MLO views of both breasts were obtained. Standard  spot magnification MLO view of the lumpectomy site in the LEFT breast was also obtained.  Post surgical scar/architectural distortion at the lumpectomy site in the UPPER OUTER LEFT breast at MIDDLE to POSTERIOR depth. No new or suspicious findings in the LEFT breast.  Circumscribed low-density 5 mm mass involving the OUTER retroareolar RIGHT breast at MIDDLE depth, new since the tomosynthesis images last year, but without associated architectural distortion or suspicious calcifications. No new or suspicious findings elsewhere in the RIGHT breast.  Mammographic images were processed with CAD.  Targeted RIGHT breast ultrasound is performed, showing a benign mildly complex cyst or clustered cysts at the 8 o'clock position approximately 1 cm from the nipple measuring approximately 2 x 7 x 5 mm, demonstrating posterior acoustic enhancement and no internal power Doppler flow, corresponding to the mammographic finding. A 3 mm diameter mildly complex cyst with a thin internal septation is identified at the 9 o'clock subareolar location. No suspicious solid mass or abnormal acoustic shadowing is identified.  IMPRESSION: 1. No mammographic or sonographic evidence of malignancy involving the RIGHT breast. 2. No mammographic evidence of malignancy involving the LEFT breast. 3. Benign approximate 7 mm mildly complex cyst or clustered cysts in the LOWER OUTER RIGHT breast which accounts for a mammographic finding.  RECOMMENDATION: As the patient is now 5 years out from her lumpectomy, she may return to annual screening. Screening mammogram in one year is recommended.(Code:SM-B-01Y)  I have discussed the findings and recommendations with the patient. If applicable, a reminder letter will be sent to the patient regarding the next appointment.  BI-RADS CATEGORY  2: Benign.   Electronically Signed   By: Evangeline Dakin M.D.   On: 09/21/2018 14:41   ASSESSMENT AND PLAN:  Ms..  Bryant is a pleasant 53 y.o. 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  [facility] for [facility] and routine follow-up.   1. History of breast cancer:  Sophia Bryant is currently clinically and radiographically without evidence of disease or recurrence of breast cancer. She will be due for mammogram in 09/2019.  She will return to see [place] in 1 year for LTS f/u.   I encouraged her to call me with any questions or concerns before her next visit at the cancer center, and I  would be happy to see her sooner, if needed.    2. Lung nodule: Ct chest in 01/2019 ordered.    3. Bone health:  Given Sophia Bryant age, history of breast cancer and tobacco history, she is at risk for bone demineralization. She has osteoporosis, and does not want bisphosphanate therapy at this point.  I counseled her on calcium, vitamin d and weight bearing exercises.  She was recommended to stop smoking.    4. Cancer screening:  Due to Sophia Bryant history and her age, she should receive screening for skin cancers, colon cancer, and gynecologic cancers.. She was encouraged to follow-up with her PCP for appropriate cancer screenings.   5. Health maintenance and wellness promotion: Sophia Bryant was encouraged to consume 5-7 servings of fruits and vegetables per day. She was also encouraged to engage in moderate to vigorous exercise for 30 minutes per day most days of the week. She was instructed to limit her alcohol consumption and to stop smoking  Dispo:  -Return to cancer center in one year for LTS follow up -Mammogram in 09/2019 -Bone Density in 08/2020 -CT chest in 01/2019   A total of (30) minutes of face-to-face time was spent with this patient with greater than 50% of that time in counseling and care-coordination.   Sophia Phlegm, NP [facility] [place]  [phone]   Note: PRIMARY CARE PROVIDER Patient, No Pcp Per None None

## 2019-01-04 ENCOUNTER — Other Ambulatory Visit: Payer: Self-pay

## 2019-01-04 ENCOUNTER — Ambulatory Visit (HOSPITAL_COMMUNITY)
Admission: RE | Admit: 2019-01-04 | Discharge: 2019-01-04 | Disposition: A | Discharge status: Home / Self Care | Payer: PRIVATE HEALTH INSURANCE | Source: Ambulatory Visit | Attending: Adult Health | Admitting: Adult Health

## 2019-01-04 DIAGNOSIS — R911 Solitary pulmonary nodule: Secondary | ICD-10-CM | POA: Diagnosis present

## 2019-01-07 ENCOUNTER — Ambulatory Visit (HOSPITAL_COMMUNITY): Payer: PRIVATE HEALTH INSURANCE

## 2019-01-07 ENCOUNTER — Telehealth: Payer: Self-pay

## 2019-01-07 NOTE — Telephone Encounter (Signed)
-----   Message from Gardenia Phlegm, NP sent at 01/04/2019  4:19 PM EST ----- Please call patient with results.  Nodule has completely resolved that we were following.  No need for further f/u.  It was likely reactive. ----- Message ----- From: Interface, Rad Results In Sent: 01/04/2019   3:39 PM EST To: Gardenia Phlegm, NP

## 2019-01-07 NOTE — Telephone Encounter (Signed)
Spoke with patient to inform that nodule has completely resolved and no need for further f/u per NP.  Patient reports that she is having her gall bladder taken out on Dec.18th and requested this be put in her chart.  She voiced understanding of above info and had no questions or concerns at this time.

## 2019-01-08 ENCOUNTER — Ambulatory Visit (HOSPITAL_COMMUNITY): Payer: PRIVATE HEALTH INSURANCE

## 2019-02-07 ENCOUNTER — Ambulatory Visit (HOSPITAL_COMMUNITY): Payer: PRIVATE HEALTH INSURANCE

## 2019-08-16 ENCOUNTER — Other Ambulatory Visit: Payer: Self-pay | Admitting: Adult Health

## 2019-08-16 DIAGNOSIS — Z1231 Encounter for screening mammogram for malignant neoplasm of breast: Secondary | ICD-10-CM

## 2019-09-26 ENCOUNTER — Other Ambulatory Visit: Payer: Self-pay

## 2019-09-26 ENCOUNTER — Ambulatory Visit
Admission: RE | Admit: 2019-09-26 | Discharge: 2019-09-26 | Disposition: A | Discharge status: Home / Self Care | Payer: PRIVATE HEALTH INSURANCE | Source: Ambulatory Visit | Attending: Adult Health | Admitting: Adult Health

## 2019-09-26 DIAGNOSIS — Z1231 Encounter for screening mammogram for malignant neoplasm of breast: Secondary | ICD-10-CM

## 2019-11-20 ENCOUNTER — Telehealth: Payer: Self-pay | Admitting: Adult Health

## 2019-11-20 NOTE — Telephone Encounter (Signed)
Per sch msg, patient wants to cancel appt. Called and spoke with patient. Cancelled 10/22 appt. Does not want to reschedule at this time

## 2019-11-21 ENCOUNTER — Encounter: Payer: PRIVATE HEALTH INSURANCE | Admitting: Adult Health

## 2019-11-22 ENCOUNTER — Encounter: Payer: PRIVATE HEALTH INSURANCE | Admitting: Adult Health

## 2022-03-31 ENCOUNTER — Encounter: Payer: Self-pay | Admitting: Internal Medicine
